# Patient Record
Sex: Female | Born: 1980 | Race: White | Hispanic: No | Marital: Married | Smoking: Never smoker
Health system: Southern US, Community
[De-identification: ages and names within clinical notes are randomized; demographics above are authoritative.]

## PROBLEM LIST (undated history)

## (undated) ENCOUNTER — Inpatient Hospital Stay (HOSPITAL_COMMUNITY): Payer: Self-pay

## (undated) DIAGNOSIS — F419 Anxiety disorder, unspecified: Secondary | ICD-10-CM

## (undated) DIAGNOSIS — E559 Vitamin D deficiency, unspecified: Secondary | ICD-10-CM

## (undated) DIAGNOSIS — G43909 Migraine, unspecified, not intractable, without status migrainosus: Secondary | ICD-10-CM

## (undated) DIAGNOSIS — D649 Anemia, unspecified: Secondary | ICD-10-CM

## (undated) DIAGNOSIS — O139 Gestational [pregnancy-induced] hypertension without significant proteinuria, unspecified trimester: Secondary | ICD-10-CM

## (undated) DIAGNOSIS — T7840XA Allergy, unspecified, initial encounter: Secondary | ICD-10-CM

## (undated) DIAGNOSIS — N393 Stress incontinence (female) (male): Secondary | ICD-10-CM

## (undated) HISTORY — DX: Anxiety disorder, unspecified: F41.9

## (undated) HISTORY — DX: Anemia, unspecified: D64.9

## (undated) HISTORY — PX: TONSILLECTOMY AND ADENOIDECTOMY: SHX28

## (undated) HISTORY — DX: Allergy, unspecified, initial encounter: T78.40XA

## (undated) HISTORY — DX: Stress incontinence (female) (male): N39.3

## (undated) HISTORY — DX: Migraine, unspecified, not intractable, without status migrainosus: G43.909

---

## 2013-11-18 LAB — HM HIV SCREENING LAB: HM HIV Screening: NEGATIVE

## 2017-12-04 ENCOUNTER — Encounter (INDEPENDENT_AMBULATORY_CARE_PROVIDER_SITE_OTHER): Payer: Self-pay

## 2017-12-04 ENCOUNTER — Encounter: Payer: Self-pay | Admitting: Internal Medicine

## 2017-12-04 ENCOUNTER — Ambulatory Visit (INDEPENDENT_AMBULATORY_CARE_PROVIDER_SITE_OTHER): Payer: Managed Care, Other (non HMO) | Admitting: Internal Medicine

## 2017-12-04 DIAGNOSIS — Z9109 Other allergy status, other than to drugs and biological substances: Secondary | ICD-10-CM

## 2017-12-04 NOTE — Progress Notes (Signed)
HPI  Pt presents to the clinic today to establish care and for management of the conditions listed below. She is transferring care from [facility].  Environmental Allergies: Controlled on daily Claritin and Flonase.  Flu: never Tetanus: 12/2014 Pap Smear: 2017 Dentist: biannually  No past medical history on file.  Current Outpatient Medications  Medication Sig Dispense Refill  . Cholecalciferol (VITAMIN D3) 1000 units CAPS Take 1 capsule by mouth daily.    . fluticasone (FLONASE) 50 MCG/ACT nasal spray Place into both nostrils daily.    Marland Kitchen loratadine (CLARITIN) 10 MG tablet Take by mouth.     No current facility-administered medications for this visit.     Allergies  Allergen Reactions  . Codeine Other (See Comments)    Hallucinations  . Penicillins Other (See Comments)    No family history on file.  Social History   Socioeconomic History  . Marital status: Married    Spouse name: Not on file  . Number of children: Not on file  . Years of education: Not on file  . Highest education level: Not on file  Occupational History  . Not on file  Social Needs  . Financial resource strain: Not on file  . Food insecurity:    Worry: Not on file    Inability: Not on file  . Transportation needs:    Medical: Not on file    Non-medical: Not on file  Tobacco Use  . Smoking status: Not on file  Substance and Sexual Activity  . Alcohol use: Not on file  . Drug use: Not on file  . Sexual activity: Not on file  Lifestyle  . Physical activity:    Days per week: Not on file    Minutes per session: Not on file  . Stress: Not on file  Relationships  . Social connections:    Talks on phone: Not on file    Gets together: Not on file    Attends religious service: Not on file    Active member of club or organization: Not on file    Attends meetings of clubs or organizations: Not on file    Relationship status: Not on file  . Intimate partner violence:    Fear of current or  ex partner: Not on file    Emotionally abused: Not on file    Physically abused: Not on file    Forced sexual activity: Not on file  Other Topics Concern  . Not on file  Social History Narrative  . Not on file    ROS:  Constitutional: Denies fever, malaise, fatigue, headache or abrupt weight changes.  HEENT: Denies eye pain, eye redness, ear pain, ringing in the ears, wax buildup, runny nose, nasal congestion, bloody nose, or sore throat. Respiratory: Denies difficulty breathing, shortness of breath, cough or sputum production.   Cardiovascular: Denies chest pain, chest tightness, palpitations or swelling in the hands or feet.  Gastrointestinal: Denies abdominal pain, bloating, constipation, diarrhea or blood in the stool.  GU: Denies frequency, urgency, pain with urination, blood in urine, odor or discharge. Musculoskeletal: Denies decrease in range of motion, difficulty with gait, muscle pain or joint pain and swelling.  Skin: Denies redness, rashes, lesions or ulcercations.  Neurological: Denies dizziness, difficulty with memory, difficulty with speech or problems with balance and coordination.  Psych: Denies anxiety, depression, SI/HI.  No other specific complaints in a complete review of systems (except as listed in HPI above).  PE:  BP 116/78   Pulse  63   Temp 98.1 F (36.7 C) (Oral)   Ht 5' 4.5" (1.638 m)   Wt 168 lb (76.2 kg)   SpO2 98%   BMI 28.39 kg/m   Wt Readings from Last 3 Encounters:  No data found for Wt    General: Appears her stated age, well developed, well nourished in NAD. HEENT: Head: normal shape and size; Eyes: sclera white, no icterus, conjunctiva pink, PERRLA and EOMs intact; Ears: Tm's gray and intact, normal light reflex;Throat/Mouth: Teeth present, mucosa pink and moist, no lesions or ulcerations noted.  Cardiovascular: Normal rate and rhythm. S1,S2 noted.  No murmur, rubs or gallops noted.  Pulmonary/Chest: Normal effort and positive vesicular  breath sounds. No respiratory distress. No wheezes, rales or ronchi noted.  Neurological: Alert and oriented.  Psychiatric: Mood and affect normal. Behavior is normal. Judgment and thought content normal.    Assessment and Plan:

## 2017-12-04 NOTE — Patient Instructions (Signed)
Pelvic Floor Exercises for Bowel Control  Exercises using both the external anal sphincter and the deep pelvic floor muscles can help you to improve your bowel control. When done correctly, these exercises can tone and strengthen the muscles to help you hold back gas and prevent fecal incontinence (leakage of stool). Exercise programs take time; you may not see any noticeable change in your bowel control immediately.  In some cases it may take several months to regain control.  Bowel Control Muscles The anus and the anal canal, has rings of muscle around it. The outer ring of muscle is called the external anal sphincter; it is a voluntary muscle which you can learn to tighten and close more efficiently. When you contract it you will feel the skin around your anus tighten and pull in as if the anus is winking. Try to keep the buttocks muscles relaxed. The inner ring around the anus is the internal anal sphincter. It is an involuntary and automatic muscle; you don't have to think to keep it closed or open.  This muscle should be closed at all times, except when you are actually trying to have a bowel movement.  In addition to the sphincter muscles, there are deeper muscles called the levator ani that form a sling from your tailbone to your pubic bone. The levator ani muscle has a specific part called the puborectalis that holds stool in until you give the signal to relax and empty.  When you contract these muscles it creates a feeling of lifting the anus inward.  External Anal Sphincter        Levator ani deep layer   Effective Exercises for Control of Gas and Bowels . Identify the specific areas of the pelvic floor muscles you need to use.  This can be done using a mirror to see if you are contracting the correct muscles or by placing the pad of your finger at or just inside the anal opening. . Develop an exercise plan for strength, endurance and quick response of the muscles and stick with it.  You  must make the muscles do more than they are used to doing. . Incorporate the exercises into your daily activities.   2007, [employer] Doc.36  

## 2017-12-10 DIAGNOSIS — Z9109 Other allergy status, other than to drugs and biological substances: Secondary | ICD-10-CM | POA: Insufficient documentation

## 2017-12-10 NOTE — Assessment & Plan Note (Signed)
Continue Claritin and Flonase 

## 2018-03-05 ENCOUNTER — Encounter: Payer: Managed Care, Other (non HMO) | Admitting: Internal Medicine

## 2018-04-21 ENCOUNTER — Encounter: Payer: Managed Care, Other (non HMO) | Admitting: Internal Medicine

## 2018-04-21 DIAGNOSIS — Z0289 Encounter for other administrative examinations: Secondary | ICD-10-CM

## 2018-04-21 NOTE — Progress Notes (Deleted)
Subjective:    Patient ID: [name], female    DOB: 04-Nov-1980, 37 y.o.   MRN: [id number]  HPI  Pt presents to the clinic today for her annual exam.  Flu: never Tetanus: 12/2014 Pap Smear: 2017 Dentist:  Diet: Exercise:  Review of Systems  Past Medical History:  Diagnosis Date  . Allergy     Current Outpatient Medications  Medication Sig Dispense Refill  . Cholecalciferol (VITAMIN D3) 1000 units CAPS Take 1 capsule by mouth daily.    . fluticasone (FLONASE) 50 MCG/ACT nasal spray Place into both nostrils daily.    Marland Kitchen loratadine (CLARITIN) 10 MG tablet Take by mouth.     No current facility-administered medications for this visit.     Allergies  Allergen Reactions  . Codeine Other (See Comments)    Hallucinations  . Penicillins Other (See Comments)    Family History  Problem Relation Age of Onset  . Diabetes Mother   . Hypertension Father   . Thyroid disease Sister   . Hypertension Sister   . Thyroid disease Maternal Grandmother   . Skin cancer Maternal Grandmother   . Diabetes Maternal Grandfather   . Glaucoma Maternal Grandfather   . Diabetes Paternal Grandmother   . Skin cancer Paternal Grandfather     Social History   Socioeconomic History  . Marital status: Married    Spouse name: Not on file  . Number of children: Not on file  . Years of education: Not on file  . Highest education level: Not on file  Occupational History  . Not on file  Social Needs  . Financial resource strain: Not on file  . Food insecurity:    Worry: Not on file    Inability: Not on file  . Transportation needs:    Medical: Not on file    Non-medical: Not on file  Tobacco Use  . Smoking status: Never Smoker  . Smokeless tobacco: Never Used  Substance and Sexual Activity  . Alcohol use: Yes    Comment: occasional  . Drug use: Never  . Sexual activity: Not on file  Lifestyle  . Physical activity:    Days per week: Not on file    Minutes per session: Not on  file  . Stress: Not on file  Relationships  . Social connections:    Talks on phone: Not on file    Gets together: Not on file    Attends religious service: Not on file    Active member of club or organization: Not on file    Attends meetings of clubs or organizations: Not on file    Relationship status: Not on file  . Intimate partner violence:    Fear of current or ex partner: Not on file    Emotionally abused: Not on file    Physically abused: Not on file    Forced sexual activity: Not on file  Other Topics Concern  . Not on file  Social History Narrative  . Not on file     Constitutional: Denies fever, malaise, fatigue, headache or abrupt weight changes.  HEENT: Denies eye pain, eye redness, ear pain, ringing in the ears, wax buildup, runny nose, nasal congestion, bloody nose, or sore throat. Respiratory: Denies difficulty breathing, shortness of breath, cough or sputum production.   Cardiovascular: Denies chest pain, chest tightness, palpitations or swelling in the hands or feet.  Gastrointestinal: Denies abdominal pain, bloating, constipation, diarrhea or blood in the stool.  GU: Denies urgency, frequency,  pain with urination, burning sensation, blood in urine, odor or discharge. Musculoskeletal: Denies decrease in range of motion, difficulty with gait, muscle pain or joint pain and swelling.  Skin: Denies redness, rashes, lesions or ulcercations.  Neurological: Denies dizziness, difficulty with memory, difficulty with speech or problems with balance and coordination.  Psych: Denies anxiety, depression, SI/HI.  No other specific complaints in a complete review of systems (except as listed in HPI above).     Objective:   Physical Exam        Assessment & Plan:

## 2018-09-08 ENCOUNTER — Other Ambulatory Visit (INDEPENDENT_AMBULATORY_CARE_PROVIDER_SITE_OTHER): Payer: Managed Care, Other (non HMO)

## 2018-09-08 VITALS — BP 128/84 | HR 81

## 2018-09-08 DIAGNOSIS — Z3201 Encounter for pregnancy test, result positive: Secondary | ICD-10-CM | POA: Diagnosis not present

## 2018-09-08 LAB — POCT URINE PREGNANCY: Preg Test, Ur: POSITIVE — AB

## 2018-09-08 NOTE — Progress Notes (Signed)
Pt here today for UPT. Had 3 positive tests at home. Per pt LMP was 07/27/2018.  UPT positive today in office, according to LMP pt is  6weeks 1day. Reviewed medications with patient, pt now taking PNV and no other medications. Denies any issues at this time. Pt will follow up with new OB visit on 10/14/2018.   Scheryl Marten, RN

## 2018-09-14 NOTE — Progress Notes (Signed)
I have reviewed the chart and agree with nursing staff's documentation of this patient's encounter.  Thaddeus Evitts, MD 09/14/2018 10:42 AM    

## 2018-09-22 ENCOUNTER — Telehealth: Payer: Self-pay | Admitting: *Deleted

## 2018-09-22 NOTE — Telephone Encounter (Signed)
Pt called in stating she is having some cramping and light spotting, pt is around [redacted] weeks pregnant and did have intercourse this morning. Instructed pt to continue to monitor  And if spotting or cramping get worse to call office or go to [facility] for further evaluation.   Scheryl Marten, RN

## 2018-09-29 ENCOUNTER — Inpatient Hospital Stay (HOSPITAL_COMMUNITY): Payer: Managed Care, Other (non HMO)

## 2018-09-29 ENCOUNTER — Encounter (HOSPITAL_COMMUNITY): Payer: Self-pay

## 2018-09-29 ENCOUNTER — Telehealth: Payer: Self-pay

## 2018-09-29 ENCOUNTER — Other Ambulatory Visit: Payer: Self-pay

## 2018-09-29 ENCOUNTER — Inpatient Hospital Stay (HOSPITAL_COMMUNITY)
Admission: AD | Admit: 2018-09-29 | Discharge: 2018-09-29 | Disposition: A | Discharge status: Home / Self Care | Payer: Managed Care, Other (non HMO) | Source: Ambulatory Visit | Attending: Obstetrics & Gynecology | Admitting: Obstetrics & Gynecology

## 2018-09-29 DIAGNOSIS — Z880 Allergy status to penicillin: Secondary | ICD-10-CM | POA: Diagnosis not present

## 2018-09-29 DIAGNOSIS — O99281 Endocrine, nutritional and metabolic diseases complicating pregnancy, first trimester: Secondary | ICD-10-CM | POA: Insufficient documentation

## 2018-09-29 DIAGNOSIS — Z833 Family history of diabetes mellitus: Secondary | ICD-10-CM | POA: Insufficient documentation

## 2018-09-29 DIAGNOSIS — Z885 Allergy status to narcotic agent status: Secondary | ICD-10-CM | POA: Insufficient documentation

## 2018-09-29 DIAGNOSIS — O131 Gestational [pregnancy-induced] hypertension without significant proteinuria, first trimester: Secondary | ICD-10-CM | POA: Insufficient documentation

## 2018-09-29 DIAGNOSIS — Z3A09 9 weeks gestation of pregnancy: Secondary | ICD-10-CM | POA: Insufficient documentation

## 2018-09-29 DIAGNOSIS — O200 Threatened abortion: Secondary | ICD-10-CM | POA: Diagnosis present

## 2018-09-29 DIAGNOSIS — E559 Vitamin D deficiency, unspecified: Secondary | ICD-10-CM | POA: Insufficient documentation

## 2018-09-29 DIAGNOSIS — Z8249 Family history of ischemic heart disease and other diseases of the circulatory system: Secondary | ICD-10-CM | POA: Insufficient documentation

## 2018-09-29 DIAGNOSIS — Z6740 Type O blood, Rh positive: Secondary | ICD-10-CM

## 2018-09-29 DIAGNOSIS — Z808 Family history of malignant neoplasm of other organs or systems: Secondary | ICD-10-CM | POA: Insufficient documentation

## 2018-09-29 DIAGNOSIS — O4690 Antepartum hemorrhage, unspecified, unspecified trimester: Secondary | ICD-10-CM

## 2018-09-29 HISTORY — DX: Vitamin D deficiency, unspecified: E55.9

## 2018-09-29 HISTORY — DX: Gestational (pregnancy-induced) hypertension without significant proteinuria, unspecified trimester: O13.9

## 2018-09-29 LAB — HCG, QUANTITATIVE, PREGNANCY: hCG, Beta Chain, Quant, S: 29712 m[IU]/mL — ABNORMAL HIGH (ref <5)

## 2018-09-29 LAB — URINALYSIS, ROUTINE W REFLEX MICROSCOPIC
BILIRUBIN URINE: NEGATIVE
Bacteria, UA: NONE SEEN
GLUCOSE, UA: NEGATIVE mg/dL
Ketones, ur: NEGATIVE mg/dL
Leukocytes,Ua: NEGATIVE
NITRITE: NEGATIVE
Protein, ur: NEGATIVE mg/dL
Specific Gravity, Urine: 1.002 — ABNORMAL LOW (ref 1.005–1.030)
pH: 7 (ref 5.0–8.0)

## 2018-09-29 LAB — CBC
HCT: 37.1 % (ref 36.0–46.0)
Hemoglobin: 12.4 g/dL (ref 12.0–15.0)
MCH: 28.6 pg (ref 26.0–34.0)
MCHC: 33.4 g/dL (ref 30.0–36.0)
MCV: 85.5 fL (ref 80.0–100.0)
Platelets: 287 10*3/uL (ref 150–400)
RBC: 4.34 MIL/uL (ref 3.87–5.11)
RDW: 11.7 % (ref 11.5–15.5)
WBC: 8.6 10*3/uL (ref 4.0–10.5)
nRBC: 0 % (ref 0.0–0.2)

## 2018-09-29 LAB — WET PREP, GENITAL
Clue Cells Wet Prep HPF POC: NONE SEEN
Sperm: NONE SEEN
Trich, Wet Prep: NONE SEEN
Yeast Wet Prep HPF POC: NONE SEEN

## 2018-09-29 LAB — ABO/RH: ABO/RH(D): O POS

## 2018-09-29 NOTE — Discharge Instructions (Signed)
Threatened Miscarriage  A threatened miscarriage occurs when a woman has vaginal bleeding during the first 20 weeks of pregnancy but the pregnancy has not ended. If you have vaginal bleeding during this time, your health care provider will do tests to make sure you are still pregnant. If the tests show that you are still pregnant and that the developing baby (fetus) inside your uterus is still growing, your condition is considered a threatened miscarriage. A threatened miscarriage does not mean your pregnancy will end, but it does increase the risk of losing your pregnancy (complete miscarriage). What are the causes? The cause of this condition is usually not known. For women who go on to have a complete miscarriage, the most common cause is an abnormal number of chromosomes in the developing baby. Chromosomes are the structures inside cells that hold all of a person's genetic material. What increases the risk? The following lifestyle factors may increase your risk of a miscarriage in early pregnancy:  Smoking.  Drinking excessive amounts of alcohol or caffeine.  Recreational drug use. The following preexisting health conditions may increase your risk of a miscarriage in early pregnancy:  Polycystic ovary syndrome.  Uterine fibroids.  Infections.  Diabetes mellitus. What are the signs or symptoms? Symptoms of this condition include:  Vaginal bleeding.  Mild abdominal pain or cramps. How is this diagnosed? If you have bleeding with or without abdominal pain before 20 weeks of pregnancy, your health care provider will do tests to check whether you are still pregnant. These will include:  Ultrasound. This test uses sound waves to create images of the inside of your uterus. This allows your health care provider to look at your developing baby and other structures, such as your placenta.  Pelvic exam. This is an internal exam of your vagina and cervix.  Measurement of your baby's heart  rate.  Laboratory tests such as blood tests, urine tests, or swabs for infection You may be diagnosed with a threatened miscarriage if:  Ultrasound testing shows that you are still pregnant.  Your baby's heart rate is strong.  A pelvic exam shows that the opening between your uterus and your vagina (cervix) is closed.  Blood tests confirm that you are still pregnant. How is this treated? No treatments have been shown to prevent a threatened miscarriage from going on to a complete miscarriage. However, the right home care is important. Follow these instructions at home:  Get plenty of rest.  Do not have sex or use tampons if you have vaginal bleeding.  Do not douche.  Do not smoke or use recreational drugs.  Do not drink alcohol.  Avoid caffeine.  Keep all follow-up prenatal visits as told by your health care provider. This is important. Contact a health care provider if:  You have light vaginal bleeding or spotting while pregnant.  You have abdominal pain or cramping.  You have a fever. Get help right away if:  You have heavy vaginal bleeding.  You have blood clots coming from your vagina.  You pass tissue from your vagina.  You leak fluid, or you have a gush of fluid from your vagina.  You have severe low back pain or abdominal cramps.  You have fever, chills, and severe abdominal pain. Summary  A threatened miscarriage occurs when a woman has vaginal bleeding during the first 20 weeks of pregnancy but the pregnancy has not ended.  The cause of a threatened miscarriage is usually not known.  Symptoms of this condition may   include vaginal bleeding and mild abdominal pain or cramps.  No treatments have been shown to prevent a threatened miscarriage from going on to a complete miscarriage.  Keep all follow-up prenatal visits as told by your health care provider. This is important. This information is not intended to replace advice given to you by your health  care provider. Make sure you discuss any questions you have with your health care provider. Document Released: 06/17/2005 Document Revised: 09/13/2016 Document Reviewed: 09/13/2016 Elsevier Interactive Patient Education  2019 Elsevier Inc.  

## 2018-09-29 NOTE — MAU Note (Signed)
Pt has had spotting for a week, yesterday had a clot. Having some cramping for a week and having more pains in her lower abdomen and back. Has a HA today. Has had past miscarriage. Abdominal pain is 4/10. HA is 4-5/10. Did not have tylenol to take.

## 2018-09-29 NOTE — Telephone Encounter (Signed)
Patient called the on call number last pm regarding having some spotting when she wipes. She reports not soaking a pad every hour but she is having some cramping. I have advised patient to continued to monitored her symptoms at this time and if they get worse she should reach out to the office or go to MAU To be seen. Patient is around [redacted] weeks pregnant. Patient voice understanding at this time.

## 2018-09-29 NOTE — MAU Provider Note (Signed)
History     CSN: [id number]  Arrival date and time: 09/29/18 1150   First Provider Initiated Contact with Patient 09/29/18 1324      Chief Complaint  Patient presents with  . Vaginal Bleeding   Summer Shaffer is a 38 y.o. G3P1011 at [redacted]w[redacted]d who presents today with spotting and cramping x 1 week. She planning to be seen at the [facility] office, and has had a UPT done there on 3/10. She has a NOB visit scheduled for 10/14/2018  Vaginal Bleeding  The patient's primary symptoms include pelvic pain and vaginal bleeding. This is a new problem. The current episode started in the past 7 days. The problem occurs intermittently. The problem has been unchanged. Pain severity now: 3/10. The problem affects both sides. She is pregnant. Associated symptoms include back pain and nausea. Pertinent negatives include no chills, dysuria, fever, hematuria, urgency or vomiting. The vaginal discharge was bloody. The vaginal bleeding is spotting. She has been passing clots (about the size of a pea ). She has not been passing tissue. Nothing aggravates the symptoms. She has tried nothing for the symptoms. Her menstrual history has been regular (LMP 07/27/18).    OB History    Gravida  3   Para  1   Term  1   Preterm      AB  1   Living  1     SAB  1   TAB      Ectopic      Multiple      Live Births  1           Past Medical History:  Diagnosis Date  . Allergy   . Pregnancy induced hypertension   . Vitamin D deficiency     Past Surgical History:  Procedure Laterality Date  . TONSILLECTOMY AND ADENOIDECTOMY      Family History  Problem Relation Age of Onset  . Diabetes Mother   . Hypertension Father   . Thyroid disease Sister   . Hypertension Sister   . Thyroid disease Maternal Grandmother   . Skin cancer Maternal Grandmother   . Diabetes Maternal Grandfather   . Glaucoma Maternal Grandfather   . Diabetes Paternal Grandmother   . Skin cancer Paternal Grandfather     Social  History   Tobacco Use  . Smoking status: Never Smoker  . Smokeless tobacco: Never Used  Substance Use Topics  . Alcohol use: Not Currently    Comment: occasional  . Drug use: Never    Allergies:  Allergies  Allergen Reactions  . Codeine Other (See Comments)    Hallucinations  . Penicillins Rash    AS A BABY     No medications prior to admission.    Review of Systems  Constitutional: Negative for chills and fever.  Gastrointestinal: Positive for nausea. Negative for vomiting.  Genitourinary: Positive for pelvic pain and vaginal bleeding. Negative for dysuria, hematuria and urgency.  Musculoskeletal: Positive for back pain.   Physical Exam   Blood pressure 127/83, pulse 75, temperature 98.4 F (36.9 C), temperature source Oral, resp. rate 16, height 5\' 4"  (1.626 m), weight 80.6 kg, last menstrual period 07/27/2018, SpO2 100 %.  Physical Exam  Nursing note and vitals reviewed. Constitutional: She is oriented to person, place, and time. She appears well-developed and well-nourished. No distress.  HENT:  Head: Normocephalic.  Cardiovascular: Normal rate.  Respiratory: Effort normal.  GI: Soft. There is no abdominal tenderness. There is no rebound.  Neurological: She  is alert and oriented to person, place, and time.  Skin: Skin is warm and dry.  Psychiatric: She has a normal mood and affect.   Results for orders placed or performed during the hospital encounter of 09/29/18 (from the past 24 hour(s))  Urinalysis, Routine w reflex microscopic     Status: Abnormal   Collection Time: 09/29/18 12:58 PM  Result Value Ref Range   Color, Urine COLORLESS (A) YELLOW   APPearance CLEAR CLEAR   Specific Gravity, Urine 1.002 (L) 1.005 - 1.030   pH 7.0 5.0 - 8.0   Glucose, UA NEGATIVE NEGATIVE mg/dL   Hgb urine dipstick MODERATE (A) NEGATIVE   Bilirubin Urine NEGATIVE NEGATIVE   Ketones, ur NEGATIVE NEGATIVE mg/dL   Protein, ur NEGATIVE NEGATIVE mg/dL   Nitrite NEGATIVE  NEGATIVE   Leukocytes,Ua NEGATIVE NEGATIVE   RBC / HPF 0-5 0 - 5 RBC/hpf   WBC, UA 0-5 0 - 5 WBC/hpf   Bacteria, UA NONE SEEN NONE SEEN   Squamous Epithelial / LPF 0-5 0 - 5  CBC     Status: None   Collection Time: 09/29/18  1:58 PM  Result Value Ref Range   WBC 8.6 4.0 - 10.5 K/uL   RBC 4.34 3.87 - 5.11 MIL/uL   Hemoglobin 12.4 12.0 - 15.0 g/dL   HCT 14.7 82.9 - 56.2 %   MCV 85.5 80.0 - 100.0 fL   MCH 28.6 26.0 - 34.0 pg   MCHC 33.4 30.0 - 36.0 g/dL   RDW 13.0 86.5 - 78.4 %   Platelets 287 150 - 400 K/uL   nRBC 0.0 0.0 - 0.2 %  hCG, quantitative, pregnancy     Status: Abnormal   Collection Time: 09/29/18  1:58 PM  Result Value Ref Range   hCG, Beta Chain, Quant, S 29,712 (H) <5 mIU/mL  ABO/Rh     Status: None   Collection Time: 09/29/18  1:58 PM  Result Value Ref Range   ABO/RH(D) O POS    No rh immune globuloin      NOT A RH IMMUNE GLOBULIN CANDIDATE, PT RH POSITIVE Performed at [facility], [address], [place], [place] [address]   Wet prep, genital     Status: Abnormal   Collection Time: 09/29/18  2:01 PM  Result Value Ref Range   Yeast Wet Prep HPF POC NONE SEEN NONE SEEN   Trich, Wet Prep NONE SEEN NONE SEEN   Clue Cells Wet Prep HPF POC NONE SEEN NONE SEEN   WBC, Wet Prep HPF POC MANY (A) NONE SEEN   Sperm NONE SEEN    US Ob Less Than 14 Weeks With Ob Transvaginal  Result Date: 09/29/2018 CLINICAL DATA:  38 year old pregnant female presents with abdominal pain and vaginal bleeding for 1 week. Quantitative beta hCG unavailable at the time of this dictation. EDC by LMP: 05/03/2019, projecting to an expected gestational age of [redacted] weeks 1 day. EXAM: OBSTETRIC <14 WK [place] AND TRANSVAGINAL OB US TECHNIQUE: Both transabdominal and transvaginal ultrasound examinations were performed for complete evaluation of the gestation as well as the maternal uterus, adnexal regions, and pelvic cul-de-sac. Transvaginal technique was performed to assess early pregnancy. COMPARISON:   None. FINDINGS: Intrauterine gestational sac: Single intrauterine gestational sac is normal in position and contour. Yolk sac:  Visualized. Embryo:  Visualized. Embryonic Cardiac Activity: Not Visualized. CRL:  1.4 mm   out of range (less than 5 weeks) Subchorionic hemorrhage: Small perigestational bleed suggested in the lower cavity  involving less than 30% of the gestational sac circumference. Maternal uterus/adnexae: Retroverted uterus with no uterine fibroids. Right ovary measures 4.1 x 2.1 x 2.9 cm and contains a corpus luteum. Left ovary measures 3.8 x 2.0 x 1.5 cm. No abnormal ovarian or adnexal masses. No abnormal free fluid in the pelvis. IMPRESSION: 1. Single intrauterine gestational sac with yolk sac and tiny 1 mm embryonic pole, measuring less than [redacted] weeks gestational age, which is discordant with the expected gestational age of [redacted] weeks 1 day by provided menstrual dates. No embryonic cardiac activity. Small perigestational bleed. Findings are suspicious but not yet definitive for nonviable pregnancy. Recommend follow-up US in 11-14 days for definitive diagnosis. This recommendation follows SRU consensus guidelines: Diagnostic Criteria for Nonviable Pregnancy Early in the First Trimester. Malva Limes Med 2013; 409:8119-14. 2. No ovarian or adnexal abnormality.  No free fluid in the pelvis. Electronically Signed   By: Delbert Phenix M.D.   On: 09/29/2018 14:44    MAU Course  Procedures  MDM   Assessment and Plan   1. Vaginal bleeding in pregnancy   2. Threatened abortion in first trimester   3. Type O blood, Rh positive    DC home Comfort measures reviewed  1st Trimester precautions  Bleeding precautions RX: none  Return to MAU as needed FU [place] to be done around 10/09/2018, they will call her with an appointment  Follow-up Information    [facility] Follow up.   Why:  They will call you with an ultrasound appointment          Thressa Sheller DNP, CNM  09/29/18  4:05 PM

## 2018-09-30 LAB — GC/CHLAMYDIA PROBE AMP (~~LOC~~) NOT AT ARMC
Chlamydia: NEGATIVE
Neisseria Gonorrhea: NEGATIVE

## 2018-10-06 ENCOUNTER — Inpatient Hospital Stay (HOSPITAL_COMMUNITY)
Admission: AD | Admit: 2018-10-06 | Discharge: 2018-10-06 | Disposition: A | Discharge status: Home / Self Care | Payer: Managed Care, Other (non HMO) | Attending: Obstetrics & Gynecology | Admitting: Obstetrics & Gynecology

## 2018-10-06 ENCOUNTER — Inpatient Hospital Stay (HOSPITAL_COMMUNITY): Payer: Managed Care, Other (non HMO)

## 2018-10-06 ENCOUNTER — Other Ambulatory Visit: Payer: Self-pay

## 2018-10-06 ENCOUNTER — Encounter (HOSPITAL_COMMUNITY): Payer: Self-pay | Admitting: *Deleted

## 2018-10-06 DIAGNOSIS — O209 Hemorrhage in early pregnancy, unspecified: Secondary | ICD-10-CM

## 2018-10-06 DIAGNOSIS — O039 Complete or unspecified spontaneous abortion without complication: Secondary | ICD-10-CM | POA: Diagnosis not present

## 2018-10-06 DIAGNOSIS — Z79899 Other long term (current) drug therapy: Secondary | ICD-10-CM | POA: Insufficient documentation

## 2018-10-06 DIAGNOSIS — Z3A10 10 weeks gestation of pregnancy: Secondary | ICD-10-CM | POA: Insufficient documentation

## 2018-10-06 DIAGNOSIS — E559 Vitamin D deficiency, unspecified: Secondary | ICD-10-CM | POA: Insufficient documentation

## 2018-10-06 DIAGNOSIS — O208 Other hemorrhage in early pregnancy: Secondary | ICD-10-CM | POA: Diagnosis not present

## 2018-10-06 LAB — CBC
HCT: 34.2 % — ABNORMAL LOW (ref 36.0–46.0)
Hemoglobin: 11.6 g/dL — ABNORMAL LOW (ref 12.0–15.0)
MCH: 29.2 pg (ref 26.0–34.0)
MCHC: 33.9 g/dL (ref 30.0–36.0)
MCV: 86.1 fL (ref 80.0–100.0)
Platelets: 275 10*3/uL (ref 150–400)
RBC: 3.97 MIL/uL (ref 3.87–5.11)
RDW: 11.7 % (ref 11.5–15.5)
WBC: 8.6 10*3/uL (ref 4.0–10.5)
nRBC: 0 % (ref 0.0–0.2)

## 2018-10-06 LAB — HCG, QUANTITATIVE, PREGNANCY: hCG, Beta Chain, Quant, S: 15414 m[IU]/mL — ABNORMAL HIGH (ref <5)

## 2018-10-06 MED ORDER — TRAMADOL HCL 50 MG PO TABS
50.0000 mg | ORAL_TABLET | Freq: Once | ORAL | Status: AC
  Administered 2018-10-06: 50 mg via ORAL
  Filled 2018-10-06: qty 1

## 2018-10-06 NOTE — Discharge Instructions (Signed)
Coping with Pregnancy Loss °Pregnancy loss can happen any time during a pregnancy. Often the cause is not known. It is rarely because of anything you did. Pregnancy loss in early pregnancy (during the first trimester) is called a miscarriage. This type of pregnancy loss is the most common. Pregnancy loss that happens after 20 weeks of pregnancy is called fetal demise if the baby's heart stops beating before birth. Fetal demise is much less common. Some women experience spontaneous labor shortly after fetal demise resulting in a stillborn birth (stillbirth). °Any pregnancy loss can be devastating. You will need to recover both physically and emotionally. Most women are able to get pregnant again after a pregnancy loss and deliver a healthy baby. °How to manage emotional recovery ° °Pregnancy loss is very hard emotionally. You may feel many different emotions while you grieve. You may feel sad and angry. You may also feel guilty. It is normal to have periods of crying. Emotional recovery can take longer than physical recovery. It is different for everyone. °Taking these steps can help you cope: °· Remember that it is unlikely you did anything to cause the pregnancy loss. °· Share your thoughts and feelings with friends, family, and your partner. Remember that your partner is also recovering emotionally. °· Make sure you have a good support system, and do not spend too much time alone. °· Meet with a pregnancy loss counselor or join a pregnancy loss support group. °· Get enough sleep and eat a healthy diet. Return to regular exercise when you have recovered physically. °· Do not use drugs or alcohol to manage your emotions. °· Consider seeing a mental health professional to help you recover emotionally. °· Ask a friend or loved one to help you decide what to do with any clothing and nursery items you received for your baby. °In the case of a stillbirth, many women benefit from taking additional steps in the grieving  process. You may want to: °· Hold your baby after the birth. °· Name your baby. °· Request a birth certificate. °· Create a keepsake such as handprints or footprints. °· Dress your baby and have a picture taken. °· Make funeral arrangements. °· Ask for a baptism or blessing. °Hospitals have staff members who can help you with all these arrangements. °How to recognize emotional stress °It is normal to have emotional stress after a pregnancy loss. But emotional stress that lasts a long time or becomes severe requires treatment. Watch out for these signs of severe emotional stress: °· Sadness, anger, or guilt that is not going away and is interfering with your normal activities. °· Relationship problems that have occurred or gotten worse since the pregnancy loss. °· Signs of depression that last longer than 2 weeks. These may include: °? Sadness. °? Anxiety. °? Hopelessness. °? Loss of interest in activities you enjoy. °? Inability to concentrate. °? Trouble sleeping or sleeping too much. °? Loss of appetite or overeating. °? Thoughts of death or of hurting yourself. °Follow these instructions at home: °Medicines °· Take over-the-counter and prescription medicines only as told by your health care provider. °Activity °· Rest at home until your energy level returns. Return to your normal activities as told by your health care provider. Ask your health care provider what activities are safe for you. °General instructions °· Keep all follow-up visits as told by your health care provider. This is important. °· It may be helpful to meet with others who have experienced pregnancy loss. Ask your health   care provider about support groups and resources. °· To help you and your partner with the process of grieving, talk with your health care provider or seek counseling. °· When you are ready, meet with your health care provider to discuss steps to take for a future pregnancy. °Where to find more information °· [place] Department of  Health and Human Services Office on Women's Health: www.womenshealth.gov °· American Pregnancy Association: www.americanpregnancy.org °Contact a health care provider if: °· You continue to experience grief, sadness, or lack of motivation for everyday activities, and those feelings do not improve over time. °· You are struggling to recover emotionally, especially if you are using alcohol or substances to help. °Get help right away if: °· You have thoughts of hurting yourself or others. °If you ever feel like you may hurt yourself or others, or have thoughts about taking your own life, get help right away. You can go to your nearest emergency department or call: °· Your local emergency services (911 in the [place] °· A suicide crisis helpline, such as the National Suicide Prevention Lifeline at 1-800-273-8255. This is open 24 hours a day. °Summary °· Any pregnancy loss can be difficult physically and emotionally. °· You may experience many different emotions while you grieve. Emotional recovery can last longer than physical recovery. °· It is normal to have emotional stress after a pregnancy loss. But emotional stress that lasts a long time or becomes severe requires treatment. °· See your health care provider if you are struggling emotionally after a pregnancy loss. °This information is not intended to replace advice given to you by your health care provider. Make sure you discuss any questions you have with your health care provider. °Document Released: 08/28/2017 Document Revised: 08/28/2017 Document Reviewed: 08/28/2017 °Elsevier Interactive Patient Education © 2019 Else[employer] Inc. ° ° °Miscarriage °A miscarriage is the loss of an unborn baby (fetus) before the 20th week of pregnancy. Most miscarriages happen during the first 3 months of pregnancy. Sometimes, a miscarriage can happen before a woman knows that she is pregnant. °Having a miscarriage can be an emotional experience. If you have had a miscarriage, talk  with your health care provider about any questions you may have about miscarrying, the grieving process, and your plans for future pregnancy. °What are the causes? °A miscarriage may be caused by: °· Problems with the genes or chromosomes of the fetus. These problems make it impossible for the baby to develop normally. They are often the result of random errors that occur early in the development of the baby, and are not passed from parent to child (not inherited). °· Infection of the cervix or uterus. °· Conditions that affect hormone balance in the body. °· Problems with the cervix, such as the cervix opening and thinning before pregnancy is at term (cervical insufficiency). °· Problems with the uterus. These may include: °? A uterus with an abnormal shape. °? Fibroids in the uterus. °? Congenital abnormalities. These are problems that were present at birth. °· Certain medical conditions. °· Smoking, drinking alcohol, or using drugs. °· Injury (trauma). °In many cases, the cause of a miscarriage is not known. °What are the signs or symptoms? °Symptoms of this condition include: °· Vaginal bleeding or spotting, with or without cramps or pain. °· Pain or cramping in the abdomen or lower back. °· Passing fluid, tissue, or blood clots from the vagina. °How is this diagnosed? °This condition may be diagnosed based on: °· A physical exam. °· Ultrasound. °· Blood tests. °·   Urine tests. °How is this treated? °Treatment for a miscarriage is sometimes not necessary if you naturally pass all the tissue that was in your uterus. If necessary, this condition may be treated with: °· Dilation and curettage (D&C). This is a procedure in which the cervix is stretched open and the lining of the uterus (endometrium) is scraped. This is done only if tissue from the fetus or placenta remains in the body (incomplete miscarriage). °· Medicines, such as: °? Antibiotic medicine, to treat infection. °? Medicine to help the body pass any  remaining tissue. °? Medicine to reduce (contract) the size of the uterus. These medicines may be given if you have a lot of bleeding. °If you have Rh negative blood and your baby was Rh positive, you will need a shot of a medicine called Rh immunoglobulinto protect your future babies from Rh blood problems. "Rh-negative" and "Rh-positive" refer to whether or not the blood has a specific protein found on the surface of red blood cells (Rh factor). °Follow these instructions at home: °Medicines ° °· Take over-the-counter and prescription medicines only as told by your health care provider. °· If you were prescribed antibiotic medicine, take it as told by your health care provider. Do not stop taking the antibiotic even if you start to feel better. °· Do not take NSAIDs, such as aspirin and ibuprofen, unless they are approved by your health care provider. These medicines can cause bleeding. °Activity °· Rest as directed. Ask your health care provider what activities are safe for you. °· Have someone help with home and family responsibilities during this time. °General instructions °· Keep track of the number of sanitary pads you use each day and how soaked (saturated) they are. Write down this information. °· Monitor the amount of tissue or blood clots that you pass from your vagina. Save any large amounts of tissue for your health care provider to examine. °· Do not use tampons, douche, or have sex until your health care provider approves. °· To help you and your partner with the process of grieving, talk with your health care provider or seek counseling. °· When you are ready, meet with your health care provider to discuss any important steps you should take for your health. Also, discuss steps you should take to have a healthy pregnancy in the future. °· Keep all follow-up visits as told by your health care provider. This is important. °Where to find more information °· The American Congress of Obstetricians and  Gynecologists: www.acog.org °· [place]. Department of Health and Human Services Office of Women’s Health: www.womenshealth.gov °Contact a health care provider if: °· You have a fever or chills. °· You have a foul smelling vaginal discharge. °· You have more bleeding instead of less. °Get help right away if: °· You have severe cramps or pain in your back or abdomen. °· You pass blood clots or tissue from your vagina that is walnut-sized or larger. °· You soak more than 1 regular sanitary pad in an hour. °· You become light-headed or weak. °· You pass out. °· You have feelings of sadness that take over your thoughts, or you have thoughts of hurting yourself. °Summary °· Most miscarriages happen in the first 3 months of pregnancy. Sometimes miscarriage happens before a woman even knows that she is pregnant. °· Follow your health care provider's instruction for home care. Keep all follow-up appointments. °· To help you and your partner with the process of grieving, talk with your health care   provider or seek counseling. °This information is not intended to replace advice given to you by your health care provider. Make sure you discuss any questions you have with your health care provider. °Document Released: 12/11/2000 Document Revised: 07/23/2016 Document Reviewed: 07/23/2016 °Elsevier Interactive Patient Education © 2019 Elsevier Inc. ° °

## 2018-10-06 NOTE — MAU Provider Note (Signed)
History     CSN: [id number]  Arrival date and time: 10/06/18 1608   First Provider Initiated Contact with Patient 10/06/18 1708      Chief Complaint  Patient presents with  . Miscarriage   Summer Shaffer is a 38 y.o. G3P1011 at [id number] who presents for Miscarriage.  She states she has been bleeding on and off for the past two weeks.  She states she has been experiencing cramping since last night that improved and then "went down hill around 130ish."  She reports that she has been cramping for awhile, but not as severe as today.  She reports that she has gone through 3-4 pads with clots ranging from a quarter to the size of a mandarin orange.  She reports that her cramping "in this moment is like a 4," but was a 9/10 upon arrival.  She reports passing a few large clots since arrival.       OB History    Gravida  3   Para  1   Term  1   Preterm      AB  1   Living  1     SAB  1   TAB      Ectopic      Multiple      Live Births  1           Past Medical History:  Diagnosis Date  . Allergy   . Pregnancy induced hypertension   . Vitamin D deficiency     Past Surgical History:  Procedure Laterality Date  . TONSILLECTOMY AND ADENOIDECTOMY      Family History  Problem Relation Age of Onset  . Diabetes Mother   . Hypertension Father   . Thyroid disease Sister   . Hypertension Sister   . Thyroid disease Maternal Grandmother   . Skin cancer Maternal Grandmother   . Diabetes Maternal Grandfather   . Glaucoma Maternal Grandfather   . Diabetes Paternal Grandmother   . Skin cancer Paternal Grandfather     Social History   Tobacco Use  . Smoking status: Never Smoker  . Smokeless tobacco: Never Used  Substance Use Topics  . Alcohol use: Not Currently    Comment: occasional  . Drug use: Never    Allergies:  Allergies  Allergen Reactions  . Codeine Other (See Comments)    Hallucinations  . Penicillins Rash    AS A BABY     Medications Prior to  Admission  Medication Sig Dispense Refill Last Dose  . Cholecalciferol (VITAMIN D3) 1000 units CAPS Take 1 capsule by mouth daily.   10/06/2018 at Unknown time  . fluticasone (FLONASE) 50 MCG/ACT nasal spray Place into both nostrils daily.   10/06/2018 at Unknown time  . loratadine (CLARITIN) 10 MG tablet Take by mouth.   10/06/2018 at Unknown time    Review of Systems  Constitutional: Negative for chills and fever.  Respiratory: Negative for cough and shortness of breath.   Gastrointestinal: Positive for abdominal pain (Cramping). Negative for constipation, diarrhea, nausea and vomiting.  Genitourinary: Positive for vaginal bleeding. Negative for dysuria.  Neurological: Negative for dizziness, light-headedness and headaches.   Physical Exam   Blood pressure (!) 141/86, pulse 89, temperature 98.4 F (36.9 C), resp. rate 16, height 5\' 4"  (1.626 m), weight 80.6 kg, last menstrual period 07/27/2018, SpO2 100 %.  Physical Exam  Constitutional: She is oriented to person, place, and time. She appears well-developed and well-nourished.  HENT:  Head: Normocephalic  and atraumatic.  Eyes: Conjunctivae are normal.  Neck: Normal range of motion.  Cardiovascular: Normal rate.  Respiratory: Effort normal.  GI: Soft.  Genitourinary:    Genitourinary Comments: Speculum Exam: -Vaginal Vault: Pink mucosa, Copious amt of blood in vault-Blood removed from vault with 4x4 swab and ring forcep. -Cervix: Pink, no lesions, cysts, or polyps.  Appears open-clots and questionable GS removed from os with ring forceps. Scant amt active bleeding from os -Bimanual Exam: Deferred   Musculoskeletal: Normal range of motion.  Neurological: She is alert and oriented to person, place, and time.  Skin: Skin is warm and dry.  Psychiatric: She has a normal mood and affect. Her behavior is normal.    MAU Course  Procedures Results for orders placed or performed during the hospital encounter of 10/06/18 (from the past 24  hour(s))  CBC     Status: Abnormal   Collection Time: 10/06/18  5:07 PM  Result Value Ref Range   WBC 8.6 4.0 - 10.5 K/uL   RBC 3.97 3.87 - 5.11 MIL/uL   Hemoglobin 11.6 (L) 12.0 - 15.0 g/dL   HCT 91.7 (L) 91.5 - 05.6 %   MCV 86.1 80.0 - 100.0 fL   MCH 29.2 26.0 - 34.0 pg   MCHC 33.9 30.0 - 36.0 g/dL   RDW 97.9 48.0 - 16.5 %   Platelets 275 150 - 400 K/uL   nRBC 0.0 0.0 - 0.2 %  hCG, quantitative, pregnancy     Status: Abnormal   Collection Time: 10/06/18  5:07 PM  Result Value Ref Range   hCG, Beta Chain, Quant, S 15,414 (H) <5 mIU/mL    MDM Pelvic Exam  Labs: UA, CBC, HcG  Assessment and Plan  38 year old G3P1011 at 10.1 weeks by LMP Vaginal Bleeding  -Exam findings discussed -Vaginal/Cervical contents sent to pathology -Will give Tramadol for pain -Send for [place]  Follow Up (6:28 PM)  -Preliminary US reveals no IUG. -HCG with drop from 29,712 to 15,414 -Results discussed with patient -Condolences given. -Informed of need for follow up quant and provider visit in 2 weeks. -Will send message to staff at [facility] -Bleeding expectations and precautions given. -Instructed to wait at least 2 normal menstrual cycles before trying to conceive.  -Pelvic Rest x 6 weeks, but otherwise okay to return to normal activities. -Informed that okay to take tylenol and/or ibuprofen for pain if needed. -No other questions or concerns. -Encouraged to call or return to MAU if symptoms worsen or with the onset of new symptoms. -Discharged to home in stable condition   Cherre Robins MSN, CNM 10/06/2018, 5:08 PM

## 2018-10-06 NOTE — MAU Note (Signed)
Pt presents to MAU with complaints of an increase in vaginal bleeding that started earlier today. Was evaluated last week and was told her U/S was behind on dates. Lower abdominal cramping

## 2018-10-12 ENCOUNTER — Ambulatory Visit (HOSPITAL_COMMUNITY): Payer: Managed Care, Other (non HMO)

## 2018-10-14 ENCOUNTER — Encounter: Payer: Managed Care, Other (non HMO) | Admitting: Family Medicine

## 2018-10-20 ENCOUNTER — Encounter: Payer: Self-pay | Admitting: Obstetrics & Gynecology

## 2018-10-20 ENCOUNTER — Other Ambulatory Visit: Payer: Self-pay

## 2018-10-20 ENCOUNTER — Telehealth (INDEPENDENT_AMBULATORY_CARE_PROVIDER_SITE_OTHER): Payer: Managed Care, Other (non HMO) | Admitting: Obstetrics & Gynecology

## 2018-10-20 DIAGNOSIS — O039 Complete or unspecified spontaneous abortion without complication: Secondary | ICD-10-CM | POA: Diagnosis not present

## 2018-10-20 DIAGNOSIS — F439 Reaction to severe stress, unspecified: Secondary | ICD-10-CM

## 2018-10-20 NOTE — Patient Instructions (Signed)
Make appt for 4 weeks for blood draw and possible IUD insertion

## 2018-10-20 NOTE — Progress Notes (Signed)
TELEHEALTH VIRTUAL GYNECOLOGY VISIT ENCOUNTER NOTE  I connected with Summer Shaffer on 10/20/18 at  9:30 AM EDT by Webex at home and verified that I am speaking with the correct person using two identifiers.   I discussed the limitations, risks, security and privacy concerns of performing an evaluation and management service by telephone and the availability of in person appointments. I also discussed with the patient that there may be a patient responsible charge related to this service. The patient expressed understanding and agreed to proceed.   History:  Summer Shaffer is a 38 y.o. 843P1011 female being evaluated today for follow up after a complete miscarriage 2 weeks ago. She reports less bleeding, has not had sex. The only problems are headaches which are relieved with IBU and feeling greatly stressed. She has a h/o auditory hallucinations with her last micarriage. She denies any symptoms of psychosis and denies HI and SI.      Past Medical History:  Diagnosis Date  . Allergy   . Pregnancy induced hypertension   . Vitamin D deficiency    Past Surgical History:  Procedure Laterality Date  . TONSILLECTOMY AND ADENOIDECTOMY     The following portions of the patient's history were reviewed and updated as appropriate: allergies, current medications, past family history, past medical history, past social history, past surgical history and problem list.     Review of Systems:  Pertinent items noted in HPI and remainder of comprehensive ROS otherwise negative.  Physical Exam:   General:  Alert, oriented and cooperative.   Mental Status: Normal mood and affect perceived. Normal judgment and thought content.  Physical exam deferred due to nature of the encounter  Labs and Imaging Results for orders placed or performed during the hospital encounter of 10/06/18 (from the past 336 hour(s))  CBC   Collection Time: 10/06/18  5:07 PM  Result Value Ref Range   WBC 8.6 4.0 - 10.5 K/uL    RBC 3.97 3.87 - 5.11 MIL/uL   Hemoglobin 11.6 (L) 12.0 - 15.0 g/dL   HCT 40.934.2 (L) 81.136.0 - 91.446.0 %   MCV 86.1 80.0 - 100.0 fL   MCH 29.2 26.0 - 34.0 pg   MCHC 33.9 30.0 - 36.0 g/dL   RDW 78.211.7 95.611.5 - 21.315.5 %   Platelets 275 150 - 400 K/uL   nRBC 0.0 0.0 - 0.2 %  hCG, quantitative, pregnancy   Collection Time: 10/06/18  5:07 PM  Result Value Ref Range   hCG, Beta Chain, Quant, S 15,414 (H) <5 mIU/mL   [place] Ob Transvaginal  Result Date: 10/06/2018 CLINICAL DATA:  Initial evaluation for vaginal bleeding, early pregnancy. EXAM: OBSTETRIC <14 WK US AND TRANSVAGINAL OB US TECHNIQUE: Both transabdominal and transvaginal ultrasound examinations were performed for complete evaluation of the gestation as well as the maternal uterus, adnexal regions, and pelvic cul-de-sac. Transvaginal technique was performed to assess early pregnancy. COMPARISON:  Prior ultrasound from 09/29/2018. FINDINGS: Intrauterine gestational sac: Previously seen small intrauterine gestational sac is no longer visualized. Associated vascularity seen within the endometrial stripe. Yolk sac:  Negative. Embryo:  Negative. Cardiac Activity: N/A Heart Rate: Insert in a bpm Subchorionic hemorrhage:  None visualized. Maternal uterus/adnexae: Ovaries are normal in appearance bilaterally. Small corpus luteal cyst noted on the right. No free fluid within the pelvis. IMPRESSION: 1. Nonvisualization of previously seen intrauterine gestational sac, compatible with interval abortion/miscarriage. Persistent vascularity within the endometrium could reflect retained products of conception. Clinical correlation recommended. 2. No other acute maternal  uterine or adnexal abnormality identified. Electronically Signed   By: Rise Mu M.D.   On: 10/06/2018 18:50   US Ob Less Than 14 Weeks With Ob Transvaginal  Result Date: 09/29/2018 CLINICAL DATA:  38 year old pregnant female presents with abdominal pain and vaginal bleeding for 1 week. Quantitative  beta hCG unavailable at the time of this dictation. EDC by LMP: 05/03/2019, projecting to an expected gestational age of [redacted] weeks 1 day. EXAM: OBSTETRIC <14 WK [place] AND TRANSVAGINAL OB US TECHNIQUE: Both transabdominal and transvaginal ultrasound examinations were performed for complete evaluation of the gestation as well as the maternal uterus, adnexal regions, and pelvic cul-de-sac. Transvaginal technique was performed to assess early pregnancy. COMPARISON:  None. FINDINGS: Intrauterine gestational sac: Single intrauterine gestational sac is normal in position and contour. Yolk sac:  Visualized. Embryo:  Visualized. Embryonic Cardiac Activity: Not Visualized. CRL:  1.4 mm   out of range (less than 5 weeks) Subchorionic hemorrhage: Small perigestational bleed suggested in the lower cavity involving less than 30% of the gestational sac circumference. Maternal uterus/adnexae: Retroverted uterus with no uterine fibroids. Right ovary measures 4.1 x 2.1 x 2.9 cm and contains a corpus luteum. Left ovary measures 3.8 x 2.0 x 1.5 cm. No abnormal ovarian or adnexal masses. No abnormal free fluid in the pelvis. IMPRESSION: 1. Single intrauterine gestational sac with yolk sac and tiny 1 mm embryonic pole, measuring less than [redacted] weeks gestational age, which is discordant with the expected gestational age of [redacted] weeks 1 day by provided menstrual dates. No embryonic cardiac activity. Small perigestational bleed. Findings are suspicious but not yet definitive for nonviable pregnancy. Recommend follow-up US in 11-14 days for definitive diagnosis. This recommendation follows SRU consensus guidelines: Diagnostic Criteria for Nonviable Pregnancy Early in the First Trimester. Malva Limes Med 2013; 157:2620-35. 2. No ovarian or adnexal abnormality.  No free fluid in the pelvis. Electronically Signed   By: Delbert Phenix M.D.   On: 09/29/2018 14:44      Assessment and Plan:     1. Miscarriage  - B-HCG Quant in 4 weeks  2. Stress -  Ambulatory referral to Psychiatry  3.contraception- rec IUD since she probably doesn't want any more kids     4. Anemia- rec iron and MVI daily  I discussed the assessment and treatment plan with the patient. The patient was provided an opportunity to ask questions and all were answered. The patient agreed with the plan and demonstrated an understanding of the instructions.   The patient was advised to call back or seek an in-person evaluation/go to the ED if the symptoms worsen or if the condition fails to improve as anticipated.  I provided 20 minutes of non-face-to-face time during this encounter.   Allie Bossier, MD [facility] [employer], [facility]

## 2018-11-17 ENCOUNTER — Ambulatory Visit (INDEPENDENT_AMBULATORY_CARE_PROVIDER_SITE_OTHER): Payer: Managed Care, Other (non HMO) | Admitting: Clinical

## 2018-11-17 ENCOUNTER — Other Ambulatory Visit: Payer: Self-pay

## 2018-11-17 ENCOUNTER — Other Ambulatory Visit: Payer: Managed Care, Other (non HMO)

## 2018-11-17 DIAGNOSIS — F4323 Adjustment disorder with mixed anxiety and depressed mood: Secondary | ICD-10-CM | POA: Diagnosis not present

## 2018-11-17 DIAGNOSIS — F4321 Adjustment disorder with depressed mood: Secondary | ICD-10-CM | POA: Diagnosis not present

## 2018-11-17 DIAGNOSIS — O039 Complete or unspecified spontaneous abortion without complication: Secondary | ICD-10-CM

## 2018-11-17 NOTE — Addendum Note (Signed)
Addended by: Cheree Ditto, Christan Defranco A on: 11/17/2018 09:03 AM   Modules accepted: Orders

## 2018-11-17 NOTE — BH Specialist Note (Signed)
Attempt to call patient to set up Webex video visit  Integrated Behavioral Health Initial Visit   MRN: [id number] Name: Summer Shaffer  Number of Integrated Behavioral Health Clinician visits:: 1/6 Session Start time: 3:43  Session End time: 4:49 Total time: 1 hour  Type of Service: Integrated Behavioral Health- Individual/Family Interpretor:No. Interpretor Name and Language: n/a   Warm Hand Off Completed.       SUBJECTIVE: Summer Shaffer is a 38 y.o. female accompanied by n/a Patient was referred by Nicholaus Bloom, MD for stress after miscarriage. Patient reports the following symptoms/concerns: Pt states her primary symptoms are sleep difficulty, lack of concentration, anxiety, excessive worry, irritability, along with difficulty relaxing and dread, and "brief moments" of feeling down, that have all escalated after having a miscarriage at 16wks. Pt is no longer having scary dreams and "weird symptoms, like hearing a baby cry" as happened after 4yo daughters birth and after this miscarriage. Pt requests self-coping strategies, to help manage work and life stress during this time.  Duration of problem: Over one month; Severity of problem: moderately severe  OBJECTIVE: Mood: Anxious and Affect: Appropriate Risk of harm to self or others: No plan to harm self or others  LIFE CONTEXT: Family and Social: Pt lives with her husband and 4yo daughter School/Work: Surveyor, mining Self-Care: Recognizing a greater need for self-care Life Changes: Recent miscarriage, in midst of pandemic (increase in work hours)  GOALS ADDRESSED: Patient will: 1. Reduce symptoms of: anxiety, depression and stress 2. Increase knowledge and/or ability of: coping skills, healthy habits and stress reduction  3. Demonstrate ability to: Increase healthy adjustment to current life circumstances, Increase adequate support systems for patient/family and Begin healthy grieving over  loss  INTERVENTIONS: Interventions utilized: Solution-Focused Strategies, Mindfulness or Management consultant, Psychoeducation and/or Health Education and Link to [employer]  Standardized Assessments completed: GAD-7 and PHQ 9  ASSESSMENT: Patient currently experiencing Adjustment disorder with mixed anxious and depressed mood and Grief   Patient may benefit from psychoeducation and brief therapeutic interventions regarding coping with symptoms of anxiety and depression related to both grief and life stress .  PLAN: 1. Follow up with behavioral health clinician on : Two weeks 2. Behavioral recommendations:  -Continue taking medication as recommended and prescribed by medical providers; begin using orange juice (instead of soda) with iron pills -CALM relaxation breathing exercise twice daily (morning; at bedtime) -Begin using sleep sounds at bedtime for improved family sleep -Begin Worry Time strategy tomorrow, 11/18/18; continue once/day for as long as remains helpful  3. Referral(s): Integrated Art gallery manager (In Clinic) and Community Resources:  Postpartum support 4. "From scale of 1-10, how likely are you to follow plan?": 10  Jamie C McMannes, LCSW  Type of Visit: Telephonic, after "low bandwidth" issues on video visit Patient location: Home [facility] Provider location: [facility] All persons participating in visit: Patient Data processing manager and [facility] Jamie McMannes  Confirmed patient's address: Yes  Confirmed patient's phone number: Yes  Any changes to demographics: No   Confirmed patient's insurance: Yes  Any changes to patient's insurance: No   Discussed confidentiality: No    The following statements were read to the patient and/or legal guardian that are established with the [facility] Provider.  "The purpose of this phone visit is to provide behavioral health care while limiting exposure to the coronavirus (COVID19).  There is a possibility of technology  failure and discussed alternative modes of communication if that failure occurs."  "By engaging in this telephone visit,  you consent to the provision of healthcare.  Additionally, you authorize for your insurance to be billed for the services provided during this telephone visit."   Patient and/or legal guardian consented to telephone visit: Yes   STRENGTHS (Protective Factors/Coping Skills): Supportive family; high self-awareness  Rae LipsJamie C McMannes

## 2018-11-18 LAB — BETA HCG QUANT (REF LAB): hCG Quant: <1 m[IU]/mL

## 2018-11-26 ENCOUNTER — Other Ambulatory Visit: Payer: Self-pay

## 2018-11-30 ENCOUNTER — Ambulatory Visit (INDEPENDENT_AMBULATORY_CARE_PROVIDER_SITE_OTHER): Payer: Managed Care, Other (non HMO) | Admitting: Obstetrics & Gynecology

## 2018-11-30 ENCOUNTER — Encounter: Payer: Self-pay | Admitting: *Deleted

## 2018-11-30 ENCOUNTER — Telehealth: Payer: Self-pay | Admitting: Internal Medicine

## 2018-11-30 ENCOUNTER — Other Ambulatory Visit: Payer: Self-pay

## 2018-11-30 VITALS — BP 129/83 | HR 80 | Wt 177.0 lb

## 2018-11-30 DIAGNOSIS — N898 Other specified noninflammatory disorders of vagina: Secondary | ICD-10-CM | POA: Diagnosis not present

## 2018-11-30 DIAGNOSIS — Z30014 Encounter for initial prescription of intrauterine contraceptive device: Secondary | ICD-10-CM

## 2018-11-30 DIAGNOSIS — N760 Acute vaginitis: Secondary | ICD-10-CM | POA: Diagnosis not present

## 2018-11-30 DIAGNOSIS — B9689 Other specified bacterial agents as the cause of diseases classified elsewhere: Secondary | ICD-10-CM | POA: Diagnosis not present

## 2018-11-30 DIAGNOSIS — Z113 Encounter for screening for infections with a predominantly sexual mode of transmission: Secondary | ICD-10-CM

## 2018-11-30 DIAGNOSIS — Z3202 Encounter for pregnancy test, result negative: Secondary | ICD-10-CM

## 2018-11-30 DIAGNOSIS — Z01419 Encounter for gynecological examination (general) (routine) without abnormal findings: Secondary | ICD-10-CM

## 2018-11-30 DIAGNOSIS — Z1151 Encounter for screening for human papillomavirus (HPV): Secondary | ICD-10-CM | POA: Diagnosis not present

## 2018-11-30 DIAGNOSIS — Z124 Encounter for screening for malignant neoplasm of cervix: Secondary | ICD-10-CM | POA: Diagnosis not present

## 2018-11-30 LAB — POCT URINE PREGNANCY: Preg Test, Ur: NEGATIVE

## 2018-11-30 MED ORDER — METRONIDAZOLE 500 MG PO TABS: 500.0000 mg | ORAL_TABLET | Freq: Two times a day (BID) | ORAL | 0 refills | Status: DC

## 2018-11-30 MED ORDER — LEVONORGESTREL 19.5 MCG/DAY IU IUD
INTRAUTERINE_SYSTEM | Freq: Once | INTRAUTERINE | Status: AC
  Administered 2018-11-30: 14:00:00 via INTRAUTERINE

## 2018-11-30 NOTE — Progress Notes (Signed)
Subjective:    Summer Shaffer is a 38 y.o. married P1 (38 yo daughter- Ivor MessierCora) female who presents for an annual exam. The patient has no complaints today. The patient is sexually active. GYN screening history: last pap: was normal. The patient wears seatbelts: yes. The patient participates in regular exercise: yes. Has the patient ever been transfused or tattooed?: no. The patient reports that there is not domestic violence in her life.   Menstrual History: OB History    Gravida  3   Para  1   Term  1   Preterm      AB  2   Living  1     SAB  2   TAB      Ectopic      Multiple      Live Births  1           Menarche age: 89 Patient's last menstrual period was 11/06/2018 (approximate).    The following portions of the patient's history were reviewed and updated as appropriate: allergies, current medications, past family history, past medical history, past social history, past surgical history and problem list.  Review of Systems Pertinent items are noted in HPI.   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   Objective:    BP 129/83   Pulse 80   Wt 177 lb (80.3 kg)   LMP 11/06/2018 (Approximate)   BMI 30.38 kg/m   General Appearance:    Alert, cooperative, no distress, appears stated age  Head:    Normocephalic, without obvious abnormality, atraumatic  Eyes:    PERRL, conjunctiva/corneas clear, EOM's intact, fundi    benign, both eyes  Ears:    Normal TM's and external ear canals, both ears  Nose:   Nares normal, septum midline, mucosa normal, no drainage    or sinus tenderness  Throat:   Lips, mucosa, and tongue normal; teeth and gums normal  Neck:   Supple, symmetrical, trachea midline, no adenopathy;    thyroid:  no enlargement/tenderness/nodules; no carotid   bruit or JVD  Back:     Symmetric, no curvature, ROM normal, no CVA tenderness   Lungs:     Clear to auscultation bilaterally, respirations unlabored  Chest Wall:    No tenderness or deformity   Heart:    Regular rate and rhythm, S1 and S2 normal, no murmur, rub   or gallop  Breast Exam:    No tenderness, masses, or nipple abnormality  Abdomen:     Soft, non-tender, bowel sounds active all four quadrants,    no masses, no organomegaly  Genitalia:    Normal female without lesion or tenderness Discharge c/w BV     Extremities:   Extremities normal, atraumatic, no cyanosis or edema  Pulses:   2+ and symmetric all extremities  Skin:   Skin color, texture, turgor normal, no rashes or lesions  Lymph nodes:   Cervical, supraclavicular, and axillary nodes normal  Neurologic:   CNII-XII intact, normal strength, sensation and reflexes    throughout  .   UPT negative, consent signed, Time out procedure done. It has been more than 2 weeks since she has had sex. Cervix prepped with betadine and grasped with a single tooth tenaculum. Liletta was easily placed and the strings were cut to 3-4 cm. Uterus sounded to 9 cm. She tolerated the procedure well.   Assessment:  Healthy female exam.   Contraception Vaginal discharge   Plan:     Thin prep Pap smear. with cotesting Wet prep sent Flagyl prescribed

## 2018-11-30 NOTE — Telephone Encounter (Signed)
Showing in chart reconcile 12/2014, but not in [facility]... please advise

## 2018-11-30 NOTE — Progress Notes (Signed)
Would like to get a PAP today, last one was in 2017 And and IUD placed

## 2018-11-30 NOTE — Telephone Encounter (Signed)
Can we call and see if she remembers getting one in 2016? If so, she doesn't need another one.
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
## 2018-11-30 NOTE — Telephone Encounter (Signed)
Patient put in an appointment request for tetanus. Is this okay to schedule?

## 2018-12-01 ENCOUNTER — Ambulatory Visit (INDEPENDENT_AMBULATORY_CARE_PROVIDER_SITE_OTHER): Payer: Managed Care, Other (non HMO) | Admitting: Clinical

## 2018-12-01 DIAGNOSIS — F4323 Adjustment disorder with mixed anxiety and depressed mood: Secondary | ICD-10-CM

## 2018-12-01 LAB — CYTOLOGY - PAP
Diagnosis: NEGATIVE
HPV: NOT DETECTED

## 2018-12-01 LAB — CERVICOVAGINAL ANCILLARY ONLY
Bacterial vaginitis: POSITIVE — AB
Candida vaginitis: NEGATIVE
Trichomonas: NEGATIVE

## 2018-12-07 ENCOUNTER — Other Ambulatory Visit: Payer: Self-pay

## 2018-12-08 ENCOUNTER — Ambulatory Visit: Payer: Managed Care, Other (non HMO)

## 2018-12-17 NOTE — Telephone Encounter (Signed)
Left message on voicemail.

## 2018-12-22 ENCOUNTER — Other Ambulatory Visit: Payer: Self-pay

## 2018-12-22 ENCOUNTER — Ambulatory Visit (INDEPENDENT_AMBULATORY_CARE_PROVIDER_SITE_OTHER): Payer: Managed Care, Other (non HMO) | Admitting: Clinical

## 2018-12-22 DIAGNOSIS — F4323 Adjustment disorder with mixed anxiety and depressed mood: Secondary | ICD-10-CM | POA: Diagnosis not present
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
## 2018-12-29 ENCOUNTER — Ambulatory Visit: Payer: Managed Care, Other (non HMO) | Admitting: Family Medicine

## 2019-01-21 DIAGNOSIS — M5416 Radiculopathy, lumbar region: Secondary | ICD-10-CM | POA: Diagnosis not present

## 2019-01-21 DIAGNOSIS — M9905 Segmental and somatic dysfunction of pelvic region: Secondary | ICD-10-CM | POA: Diagnosis not present

## 2019-01-21 DIAGNOSIS — M9903 Segmental and somatic dysfunction of lumbar region: Secondary | ICD-10-CM | POA: Diagnosis not present

## 2019-01-21 DIAGNOSIS — M955 Acquired deformity of pelvis: Secondary | ICD-10-CM | POA: Diagnosis not present

## 2019-01-27 DIAGNOSIS — M955 Acquired deformity of pelvis: Secondary | ICD-10-CM | POA: Diagnosis not present

## 2019-01-27 DIAGNOSIS — M5416 Radiculopathy, lumbar region: Secondary | ICD-10-CM | POA: Diagnosis not present

## 2019-01-27 DIAGNOSIS — M9905 Segmental and somatic dysfunction of pelvic region: Secondary | ICD-10-CM | POA: Diagnosis not present

## 2019-01-27 DIAGNOSIS — M9903 Segmental and somatic dysfunction of lumbar region: Secondary | ICD-10-CM | POA: Diagnosis not present

## 2019-01-29 DIAGNOSIS — M9903 Segmental and somatic dysfunction of lumbar region: Secondary | ICD-10-CM | POA: Diagnosis not present

## 2019-01-29 DIAGNOSIS — M5416 Radiculopathy, lumbar region: Secondary | ICD-10-CM | POA: Diagnosis not present

## 2019-01-29 DIAGNOSIS — M9905 Segmental and somatic dysfunction of pelvic region: Secondary | ICD-10-CM | POA: Diagnosis not present

## 2019-01-29 DIAGNOSIS — M955 Acquired deformity of pelvis: Secondary | ICD-10-CM | POA: Diagnosis not present

## 2019-02-01 DIAGNOSIS — M9903 Segmental and somatic dysfunction of lumbar region: Secondary | ICD-10-CM | POA: Diagnosis not present

## 2019-02-01 DIAGNOSIS — M955 Acquired deformity of pelvis: Secondary | ICD-10-CM | POA: Diagnosis not present

## 2019-02-01 DIAGNOSIS — M5416 Radiculopathy, lumbar region: Secondary | ICD-10-CM | POA: Diagnosis not present

## 2019-02-01 DIAGNOSIS — M9905 Segmental and somatic dysfunction of pelvic region: Secondary | ICD-10-CM | POA: Diagnosis not present

## 2019-02-04 DIAGNOSIS — M9903 Segmental and somatic dysfunction of lumbar region: Secondary | ICD-10-CM | POA: Diagnosis not present

## 2019-02-04 DIAGNOSIS — M9905 Segmental and somatic dysfunction of pelvic region: Secondary | ICD-10-CM | POA: Diagnosis not present

## 2019-02-04 DIAGNOSIS — M5416 Radiculopathy, lumbar region: Secondary | ICD-10-CM | POA: Diagnosis not present

## 2019-02-04 DIAGNOSIS — M955 Acquired deformity of pelvis: Secondary | ICD-10-CM | POA: Diagnosis not present

## 2019-02-08 DIAGNOSIS — M9903 Segmental and somatic dysfunction of lumbar region: Secondary | ICD-10-CM | POA: Diagnosis not present

## 2019-02-08 DIAGNOSIS — M955 Acquired deformity of pelvis: Secondary | ICD-10-CM | POA: Diagnosis not present

## 2019-02-08 DIAGNOSIS — M5416 Radiculopathy, lumbar region: Secondary | ICD-10-CM | POA: Diagnosis not present

## 2019-02-08 DIAGNOSIS — M9905 Segmental and somatic dysfunction of pelvic region: Secondary | ICD-10-CM | POA: Diagnosis not present

## 2019-05-11 ENCOUNTER — Ambulatory Visit (INDEPENDENT_AMBULATORY_CARE_PROVIDER_SITE_OTHER): Payer: BC Managed Care – PPO | Admitting: Internal Medicine

## 2019-05-11 ENCOUNTER — Encounter: Payer: Self-pay | Admitting: Internal Medicine

## 2019-05-11 ENCOUNTER — Other Ambulatory Visit: Payer: Self-pay

## 2019-05-11 VITALS — BP 128/84 | HR 68 | Temp 98.2°F | Ht 64.0 in | Wt 181.0 lb

## 2019-05-11 DIAGNOSIS — F419 Anxiety disorder, unspecified: Secondary | ICD-10-CM | POA: Diagnosis not present

## 2019-05-11 DIAGNOSIS — Z0000 Encounter for general adult medical examination without abnormal findings: Secondary | ICD-10-CM

## 2019-05-11 DIAGNOSIS — F5104 Psychophysiologic insomnia: Secondary | ICD-10-CM

## 2019-05-11 DIAGNOSIS — Z0001 Encounter for general adult medical examination with abnormal findings: Secondary | ICD-10-CM | POA: Diagnosis not present

## 2019-05-11 DIAGNOSIS — L989 Disorder of the skin and subcutaneous tissue, unspecified: Secondary | ICD-10-CM | POA: Diagnosis not present

## 2019-05-11 LAB — COMPREHENSIVE METABOLIC PANEL
ALT: 12 U/L (ref 0–35)
AST: 14 U/L (ref 0–37)
Albumin: 4.3 g/dL (ref 3.5–5.2)
Alkaline Phosphatase: 59 U/L (ref 39–117)
BUN: 6 mg/dL (ref 6–23)
CO2: 26 mEq/L (ref 19–32)
Calcium: 8.7 mg/dL (ref 8.4–10.5)
Chloride: 104 mEq/L (ref 96–112)
Creatinine, Ser: 0.65 mg/dL (ref 0.40–1.20)
GFR: 101.74 mL/min (ref >60.00)
Glucose, Bld: 100 mg/dL — ABNORMAL HIGH (ref 70–99)
Potassium: 3.5 mEq/L (ref 3.5–5.1)
Sodium: 136 mEq/L (ref 135–145)
Total Bilirubin: 0.7 mg/dL (ref 0.2–1.2)
Total Protein: 7.8 g/dL (ref 6.0–8.3)

## 2019-05-11 LAB — CBC
HCT: 38.5 % (ref 36.0–46.0)
Hemoglobin: 12.9 g/dL (ref 12.0–15.0)
MCHC: 33.6 g/dL (ref 30.0–36.0)
MCV: 88.3 fl (ref 78.0–100.0)
Platelets: 299 10*3/uL (ref 150.0–400.0)
RBC: 4.35 Mil/uL (ref 3.87–5.11)
RDW: 12.5 % (ref 11.5–15.5)
WBC: 7.1 10*3/uL (ref 4.0–10.5)

## 2019-05-11 LAB — LIPID PANEL
Cholesterol: 138 mg/dL (ref 0–200)
HDL: 44.5 mg/dL (ref >39.00)
LDL Cholesterol: 83 mg/dL (ref 0–99)
NonHDL: 93.72
Total CHOL/HDL Ratio: 3
Triglycerides: 52 mg/dL (ref 0.0–149.0)
VLDL: 10.4 mg/dL (ref 0.0–40.0)

## 2019-05-11 LAB — VITAMIN D 25 HYDROXY (VIT D DEFICIENCY, FRACTURES): VITD: 24.81 ng/mL — ABNORMAL LOW (ref 30.00–100.00)

## 2019-05-11 LAB — TSH: TSH: 1.94 u[IU]/mL (ref 0.35–4.50)

## 2019-05-11 MED ORDER — TRAZODONE HCL 50 MG PO TABS: 25.0000 mg | ORAL_TABLET | Freq: Every evening | ORAL | 0 refills | Status: DC | PRN

## 2019-05-11 NOTE — Assessment & Plan Note (Signed)
Secondary to anxiety Will trial Trazadone- sedation caution given Update me in 4 weeks and let me know how you are doing

## 2019-05-11 NOTE — Assessment & Plan Note (Signed)
Situational Support offered today Encouraged her to reach out to her therapist

## 2019-05-11 NOTE — Patient Instructions (Signed)
Health Maintenance, Female Adopting a healthy lifestyle and getting preventive care are important in promoting health and wellness. Ask your health care provider about:  The right schedule for you to have regular tests and exams.  Things you can do on your own to prevent diseases and keep yourself healthy. What should I know about diet, weight, and exercise? Eat a healthy diet   Eat a diet that includes plenty of vegetables, fruits, low-fat dairy products, and lean protein.  Do not eat a lot of foods that are high in solid fats, added sugars, or sodium. Maintain a healthy weight Body mass index (BMI) is used to identify weight problems. It estimates body fat based on height and weight. Your health care provider can help determine your BMI and help you achieve or maintain a healthy weight. Get regular exercise Get regular exercise. This is one of the most important things you can do for your health. Most adults should:  Exercise for at least 150 minutes each week. The exercise should increase your heart rate and make you sweat (moderate-intensity exercise).  Do strengthening exercises at least twice a week. This is in addition to the moderate-intensity exercise.  Spend less time sitting. Even light physical activity can be beneficial. Watch cholesterol and blood lipids Have your blood tested for lipids and cholesterol at 38 years of age, then have this test every 5 years. Have your cholesterol levels checked more often if:  Your lipid or cholesterol levels are high.  You are older than 38 years of age.  You are at high risk for heart disease. What should I know about cancer screening? Depending on your health history and family history, you may need to have cancer screening at various ages. This may include screening for:  Breast cancer.  Cervical cancer.  Colorectal cancer.  Skin cancer.  Lung cancer. What should I know about heart disease, diabetes, and high blood  pressure? Blood pressure and heart disease  High blood pressure causes heart disease and increases the risk of stroke. This is more likely to develop in people who have high blood pressure readings, are of African descent, or are overweight.  Have your blood pressure checked: ? Every 3-5 years if you are 18-39 years of age. ? Every year if you are 40 years old or older. Diabetes Have regular diabetes screenings. This checks your fasting blood sugar level. Have the screening done:  Once every three years after age 40 if you are at a normal weight and have a low risk for diabetes.  More often and at a younger age if you are overweight or have a high risk for diabetes. What should I know about preventing infection? Hepatitis B If you have a higher risk for hepatitis B, you should be screened for this virus. Talk with your health care provider to find out if you are at risk for hepatitis B infection. Hepatitis C Testing is recommended for:  Everyone born from 1945 through 1965.  Anyone with known risk factors for hepatitis C. Sexually transmitted infections (STIs)  Get screened for STIs, including gonorrhea and chlamydia, if: ? You are sexually active and are younger than 38 years of age. ? You are older than 38 years of age and your health care provider tells you that you are at risk for this type of infection. ? Your sexual activity has changed since you were last screened, and you are at increased risk for chlamydia or gonorrhea. Ask your health care provider if   you are at risk.  Ask your health care provider about whether you are at high risk for HIV. Your health care provider may recommend a prescription medicine to help prevent HIV infection. If you choose to take medicine to prevent HIV, you should first get tested for HIV. You should then be tested every 3 months for as long as you are taking the medicine. Pregnancy  If you are about to stop having your period (premenopausal) and  you may become pregnant, seek counseling before you get pregnant.  Take 400 to 800 micrograms (mcg) of folic acid every day if you become pregnant.  Ask for birth control (contraception) if you want to prevent pregnancy. Osteoporosis and menopause Osteoporosis is a disease in which the bones lose minerals and strength with aging. This can result in bone fractures. If you are 65 years old or older, or if you are at risk for osteoporosis and fractures, ask your health care provider if you should:  Be screened for bone loss.  Take a calcium or vitamin D supplement to lower your risk of fractures.  Be given hormone replacement therapy (HRT) to treat symptoms of menopause. Follow these instructions at home: Lifestyle  Do not use any products that contain nicotine or tobacco, such as cigarettes, e-cigarettes, and chewing tobacco. If you need help quitting, ask your health care provider.  Do not use street drugs.  Do not share needles.  Ask your health care provider for help if you need support or information about quitting drugs. Alcohol use  Do not drink alcohol if: ? Your health care provider tells you not to drink. ? You are pregnant, may be pregnant, or are planning to become pregnant.  If you drink alcohol: ? Limit how much you use to 0-1 drink a day. ? Limit intake if you are breastfeeding.  Be aware of how much alcohol is in your drink. In the [place] one drink equals one 12 oz bottle of beer (355 mL), one 5 oz glass of wine (148 mL), or one 1 oz glass of hard liquor (44 mL). General instructions  Schedule regular health, dental, and eye exams.  Stay current with your vaccines.  Tell your health care provider if: ? You often feel depressed. ? You have ever been abused or do not feel safe at home. Summary  Adopting a healthy lifestyle and getting preventive care are important in promoting health and wellness.  Follow your health care provider's instructions about healthy  diet, exercising, and getting tested or screened for diseases.  Follow your health care provider's instructions on monitoring your cholesterol and blood pressure. This information is not intended to replace advice given to you by your health care provider. Make sure you discuss any questions you have with your health care provider. Document Released: 12/31/2010 Document Revised: 06/10/2018 Document Reviewed: 06/10/2018 Elsevier Patient Education  2020 [employer]  

## 2019-05-11 NOTE — Progress Notes (Signed)
Subjective:    Patient ID: Summer Shaffer, female    DOB: 1980-11-30, 38 y.o.   MRN: [id number]  HPI  Pt presents to the clinic today for her annual exam.   Environmental Allergies: No new issues. Controlled on daily Claritin and Flonase.  Patient mentions she previously had a bout of anxiety for which she saw a therapist for a couple of weeks. Now, she complains of continued anxiety and lack of sleep. She has tried Melatonin but has not helped as much. She usually goes to sleep around 8:30-9pm and wakes up around 2-3 am. She would like to get something to help her with sleep.  Flu: 05/2019 Tetanus: 12/2014  Pap Smear: 11/2018 Vision: Annually Dentist: Biannually   Diet: She consumes meats. She eats fruits and veggies daily. She also eats fried foods. She mostly drinks water and occasionally Dr Malachi Bonds.  Exercise: She walks about 30 min X 3 days a week.     Review of Systems      Past Medical History:  Diagnosis Date  . Allergy   . Pregnancy induced hypertension   . Vitamin D deficiency     Current Outpatient Medications  Medication Sig Dispense Refill  . Cholecalciferol (VITAMIN D3) 1000 units CAPS Take 1 capsule by mouth daily.    . ferrous sulfate 325 (65 FE) MG EC tablet Take 325 mg by mouth 3 (three) times daily with meals.    . fluticasone (FLONASE) 50 MCG/ACT nasal spray Place into both nostrils daily.    Marland Kitchen loratadine (CLARITIN) 10 MG tablet Take by mouth.    . metroNIDAZOLE (FLAGYL) 500 MG tablet Take 1 tablet (500 mg total) by mouth 2 (two) times daily. 14 tablet 0   No current facility-administered medications for this visit.     Allergies  Allergen Reactions  . Codeine Other (See Comments)    Hallucinations  . Penicillins Rash    AS A BABY     Family History  Problem Relation Age of Onset  . Diabetes Mother   . Hypertension Father   . Thyroid disease Sister   . Hypertension Sister   . Thyroid disease Maternal Grandmother   . Skin cancer  Maternal Grandmother   . Diabetes Maternal Grandfather   . Glaucoma Maternal Grandfather   . Diabetes Paternal Grandmother   . Skin cancer Paternal Grandfather     Social History   Socioeconomic History  . Marital status: Married    Spouse name: Not on file  . Number of children: Not on file  . Years of education: Not on file  . Highest education level: Not on file  Occupational History  . Not on file  Social Needs  . Financial resource strain: Not on file  . Food insecurity    Worry: Not on file    Inability: Not on file  . Transportation needs    Medical: Not on file    Non-medical: Not on file  Tobacco Use  . Smoking status: Never Smoker  . Smokeless tobacco: Never Used  Substance and Sexual Activity  . Alcohol use: Not Currently    Comment: occasional  . Drug use: Never  . Sexual activity: Not on file  Lifestyle  . Physical activity    Days per week: Not on file    Minutes per session: Not on file  . Stress: Not on file  Relationships  . Social Herbalist on phone: Not on file    Gets together: Not  on file    Attends religious service: Not on file    Active member of club or organization: Not on file    Attends meetings of clubs or organizations: Not on file    Relationship status: Not on file  . Intimate partner violence    Fear of current or ex partner: Not on file    Emotionally abused: Not on file    Physically abused: Not on file    Forced sexual activity: Not on file  Other Topics Concern  . Not on file  Social History Narrative  . Not on file     Constitutional: Denies fever, malaise, fatigue, headache or abrupt weight changes.  HEENT: Denies eye pain, eye redness, ear pain, ringing in the ears, wax buildup, runny nose, nasal congestion, bloody nose, or sore throat. Respiratory: Denies difficulty breathing, shortness of breath, cough or sputum production.   Cardiovascular: Denies chest pain, chest tightness, palpitations or swelling in  the hands or feet.  Gastrointestinal: Denies abdominal pain, bloating, constipation, diarrhea or blood in the stool.  GU: Denies urgency, frequency, pain with urination, burning sensation, blood in urine, odor or discharge. Musculoskeletal: Denies decrease in range of motion, difficulty with gait, muscle pain or joint pain and swelling.  Skin: Denies redness, rashes, lesions or ulcercations.  Neurological: Pt reports insomnia. Denies dizziness, difficulty with memory, difficulty with speech or problems with balance and coordination.  Psych: Pt reports anxiety. Deneis depression, SI/HI.  No other specific complaints in a complete review of systems (except as listed in HPI above).  Objective:   Physical Exam  BP 128/84   Pulse 68   Temp 98.2 F (36.8 C) (Temporal)   Ht 5\' 4"  (1.626 m)   Wt 181 lb (82.1 kg)   SpO2 98%   BMI 31.07 kg/m   Wt Readings from Last 3 Encounters:  11/30/18 177 lb (80.3 kg)  10/06/18 177 lb 11.1 oz (80.6 kg)  09/29/18 177 lb 11.2 oz (80.6 kg)    General: Alert, appears her stated age, well developed, in NAD Skin: Warm, dry and intact. No rashes noted. HEENT: Head: normal shape and size; Eyes: sclera white, no icterus, conjunctiva pink and EOMs intact Neck:  Neck supple, trachea midline. No masses, lumps or thyromegaly present.  Cardiovascular: Normal rate and rhythm. S1,S2 noted.  No murmur, rubs or gallops noted. No JVD or BLE edema.  Pulmonary/Chest: Normal effort and positive vesicular breath sounds. No respiratory distress. No wheezes, rales or ronchi noted.  Abdomen: Soft and nontender. Normal bowel sounds. No distention or masses noted. Liver, spleen and kidneys non palpable. Musculoskeletal: Strength 5/5 BUE/BLE. No difficulty with gait.  Neurological: Alert and oriented. Cranial nerves II-XII grossly intact. Coordination normal.  Psychiatric: Tearful. Behavior is normal. Judgment and thought content normal.       Assessment & Plan:   Preventative Health Maintenance:  Flu shot today Tetanus UTD Pap smear UTD Encouraged patient to eat a healthy and balanced diet Encouraged patient to see a dentist annually Ordered CBC, CMP, Lipid panel, Vit D and TSH  Generalized Skin Lesion:  Pt requesting referral to dermatology for skin check, referral placed 10/01/18, NP   Pt to return in 1 year, sooner if needed

## 2019-05-28 ENCOUNTER — Telehealth: Payer: BC Managed Care – PPO | Admitting: Family

## 2019-05-28 DIAGNOSIS — N760 Acute vaginitis: Secondary | ICD-10-CM

## 2019-05-28 MED ORDER — FLUCONAZOLE 150 MG PO TABS: 150.0000 mg | ORAL_TABLET | Freq: Once | ORAL | 0 refills | Status: AC

## 2019-05-28 NOTE — Progress Notes (Signed)

## 2019-05-28 NOTE — Addendum Note (Signed)
Addended by: Dutch Quint B on: 05/28/2019 05:07 PM   Modules accepted: Orders

## 2019-06-02 ENCOUNTER — Other Ambulatory Visit: Payer: Self-pay | Admitting: Internal Medicine

## 2019-06-02 DIAGNOSIS — F5104 Psychophysiologic insomnia: Secondary | ICD-10-CM

## 2019-06-02 DIAGNOSIS — Z0000 Encounter for general adult medical examination without abnormal findings: Secondary | ICD-10-CM

## 2019-06-02 DIAGNOSIS — F419 Anxiety disorder, unspecified: Secondary | ICD-10-CM

## 2019-06-14 DIAGNOSIS — J029 Acute pharyngitis, unspecified: Secondary | ICD-10-CM | POA: Diagnosis not present

## 2019-06-14 DIAGNOSIS — J310 Chronic rhinitis: Secondary | ICD-10-CM | POA: Diagnosis not present

## 2019-06-14 DIAGNOSIS — Z03818 Encounter for observation for suspected exposure to other biological agents ruled out: Secondary | ICD-10-CM | POA: Diagnosis not present

## 2019-06-14 DIAGNOSIS — J329 Chronic sinusitis, unspecified: Secondary | ICD-10-CM | POA: Diagnosis not present

## 2019-07-04 ENCOUNTER — Other Ambulatory Visit: Payer: Self-pay | Admitting: Internal Medicine

## 2019-07-04 DIAGNOSIS — F5104 Psychophysiologic insomnia: Secondary | ICD-10-CM

## 2019-07-04 DIAGNOSIS — Z0000 Encounter for general adult medical examination without abnormal findings: Secondary | ICD-10-CM

## 2019-07-04 DIAGNOSIS — F419 Anxiety disorder, unspecified: Secondary | ICD-10-CM

## 2019-09-24 ENCOUNTER — Ambulatory Visit: Payer: BC Managed Care – PPO | Attending: Internal Medicine

## 2019-09-24 DIAGNOSIS — Z23 Encounter for immunization: Secondary | ICD-10-CM

## 2019-09-24 NOTE — Progress Notes (Signed)
   Covid-19 Vaccination Clinic  Name:  Summer Shaffer    MRN: [id number] DOB: 1980/10/04  09/24/2019  Ms. Summer Shaffer was observed post Covid-19 immunization for 30 minutes based on pre-vaccination screening without incident. She was provided with Vaccine Information Sheet and instruction to access the V-Safe system.   Ms. Summer Shaffer was instructed to call 911 with any severe reactions post vaccine: Marland Kitchen Difficulty breathing  . Swelling of face and throat  . A fast heartbeat  . A bad rash all over body  . Dizziness and weakness   Immunizations Administered    Name Date Dose VIS Date Route   Pfizer COVID-19 Vaccine 09/24/2019  9:54 AM 0.3 mL 06/11/2019 Intramuscular   Manufacturer: [employer], [employer]   Lot: [id number]   NDC: [address]-4

## 2019-10-19 ENCOUNTER — Ambulatory Visit: Payer: BC Managed Care – PPO | Attending: Internal Medicine

## 2019-10-19 DIAGNOSIS — Z23 Encounter for immunization: Secondary | ICD-10-CM

## 2019-10-19 NOTE — Progress Notes (Signed)
   Covid-19 Vaccination Clinic  Name:  Summer Shaffer    MRN: [id number] DOB: 1980-10-08  10/19/2019  Summer Shaffer was observed post Covid-19 immunization for 15 minutes without incident. She was provided with Vaccine Information Sheet and instruction to access the V-Safe system.   Summer Shaffer was instructed to call 911 with any severe reactions post vaccine: Marland Kitchen Difficulty breathing  . Swelling of face and throat  . A fast heartbeat  . A bad rash all over body  . Dizziness and weakness   Immunizations Administered    Name Date Dose VIS Date Route   Pfizer COVID-19 Vaccine 10/19/2019  8:41 AM 0.3 mL 08/25/2018 Intramuscular   Manufacturer: [employer], [employer]   Lot: [id number]   NDC: [address]-7

## 2020-05-03 ENCOUNTER — Telehealth: Payer: Self-pay

## 2020-05-03 NOTE — Telephone Encounter (Signed)
Copied from CRM [id number]. Topic: Appointment Scheduling - New Patient >> May 03, 2020  8:35 AM Elliot Gault wrote: Patient wanted to know if she can be worked in prior to her new patient appointment with Dr. Beryle Flock scheduled on 07/11/2019 (patient placed on wait list). Patient states mother is a patient of Dr. B.    Also, patient states she had a concussion a year ago and all was well and now she's starting to experience head aches. Patient will try to follow up with current PCP. Patient has new insurance [employer].

## 2020-05-03 NOTE — Telephone Encounter (Signed)
Please review. Thanks!  

## 2020-05-03 NOTE — Telephone Encounter (Signed)
Okay to use in a 40-minute slot as long as it is not back to back with another new patient.  From a quick glance, it looks like I do not have any 40-minute slots available until December 9, but that is a month earlier than her current new patient appointment, so we could use that.

## 2020-05-08 ENCOUNTER — Ambulatory Visit
Admission: RE | Admit: 2020-05-08 | Discharge: 2020-05-08 | Disposition: A | Discharge status: Home / Self Care | Payer: 59 | Source: Ambulatory Visit | Attending: Family Medicine | Admitting: Family Medicine

## 2020-05-08 VITALS — BP 134/86 | HR 60 | Temp 98.9°F

## 2020-05-08 DIAGNOSIS — G43811 Other migraine, intractable, with status migrainosus: Secondary | ICD-10-CM

## 2020-05-08 MED ORDER — RIZATRIPTAN BENZOATE 5 MG PO TABS: 5.0000 mg | ORAL_TABLET | ORAL | 0 refills | Status: DC | PRN

## 2020-05-08 NOTE — ED Provider Notes (Signed)
[facility]   [id number] 05/08/20 Arrival Time: 0936  CC: HEADACHE  SUBJECTIVE:  Keaisha Sublette is a 39 y.o. female who complains of headache for the last 10 days. Denies a precipitating event, or recent head trauma. Patient localizes pain to the temples and behind the eyes. Describes the pain as constant and throbbing in character. Patient has tried OTC excedrin without relief. Symptoms are made worse with having to focus on a computer screen. Reports similar symptoms in the past that improved with excedrin. This is not the worst headache of their life. Patient denies fever, chills, nausea, vomiting, aura, rhinorrhea, watery eyes, chest pain, SOB, abdominal pain, weakness, numbness or tingling, slurred speech.     ROS: As per HPI.  All other pertinent ROS negative.     Past Medical History:  Diagnosis Date  . Allergy   . Pregnancy induced hypertension   . Vitamin D deficiency    Past Surgical History:  Procedure Laterality Date  . TONSILLECTOMY AND ADENOIDECTOMY     Allergies  Allergen Reactions  . Codeine Other (See Comments)    Hallucinations  . Penicillins Rash    AS A BABY    No current facility-administered medications on file prior to encounter.   Current Outpatient Medications on File Prior to Encounter  Medication Sig Dispense Refill  . Cholecalciferol (VITAMIN D3) 1000 units CAPS Take 1 capsule by mouth daily.    . ferrous sulfate 325 (65 FE) MG EC tablet Take 325 mg by mouth 3 (three) times daily with meals.    . fluticasone (FLONASE) 50 MCG/ACT nasal spray Place into both nostrils daily.    Marland Kitchen levonorgestrel (MIRENA) 20 MCG/24HR IUD 1 each by Intrauterine route once.    . loratadine (CLARITIN) 10 MG tablet Take by mouth.    . Melatonin 10 MG CAPS Take by mouth. Transdermal patch not oral    . Multiple Vitamin (MULTIVITAMIN) tablet Take 1 tablet by mouth daily.    . traZODone (DESYREL) 50 MG tablet TAKE 0.5-1 TABLETS (25-50 MG TOTAL) BY MOUTH AT  BEDTIME AS NEEDED FOR SLEEP. 30 tablet 0   Social History   Socioeconomic History  . Marital status: Married    Spouse name: Not on file  . Number of children: Not on file  . Years of education: Not on file  . Highest education level: Not on file  Occupational History  . Not on file  Tobacco Use  . Smoking status: Never Smoker  . Smokeless tobacco: Never Used  Vaping Use  . Vaping Use: Never used  Substance and Sexual Activity  . Alcohol use: Not Currently    Comment: occasional  . Drug use: Never  . Sexual activity: Not on file  Other Topics Concern  . Not on file  Social History Narrative  . Not on file   Social Determinants of Health   Financial Resource Strain:   . Difficulty of Paying Living Expenses: Not on file  Food Insecurity:   . Worried About Programme researcher, broadcasting/film/video in the Last Year: Not on file  . Ran Out of Food in the Last Year: Not on file  Transportation Needs:   . Lack of Transportation (Medical): Not on file  . Lack of Transportation (Non-Medical): Not on file  Physical Activity:   . Days of Exercise per Week: Not on file  . Minutes of Exercise per Session: Not on file  Stress:   . Feeling of Stress : Not on file  Social Connections:   .  Frequency of Communication with Friends and Family: Not on file  . Frequency of Social Gatherings with Friends and Family: Not on file  . Attends Religious Services: Not on file  . Active Member of Clubs or Organizations: Not on file  . Attends Banker Meetings: Not on file  . Marital Status: Not on file  Intimate Partner Violence:   . Fear of Current or Ex-Partner: Not on file  . Emotionally Abused: Not on file  . Physically Abused: Not on file  . Sexually Abused: Not on file   Family History  Problem Relation Age of Onset  . Diabetes Mother   . Hypertension Father   . Thyroid disease Sister   . Hypertension Sister   . Thyroid disease Maternal Grandmother   . Skin cancer Maternal Grandmother     . Diabetes Maternal Grandfather   . Glaucoma Maternal Grandfather   . Diabetes Paternal Grandmother   . Skin cancer Paternal Grandfather     OBJECTIVE:  Vitals:   05/08/20 1031  BP: 134/86  Pulse: 60  Temp: 98.9 F (37.2 C)  TempSrc: Oral  SpO2: 98%    General appearance: alert; no distress Eyes: PERRLA; EOMI HENT: normocephalic; atraumatic Neck: supple with FROM Lungs: clear to auscultation bilaterally Heart: regular rate and rhythm.  Radial pulses 2+ symmetrical bilaterally Extremities: no edema; symmetrical with no gross deformities Skin: warm and dry Neurologic: CN 2-12 grossly intact; finger to nose without difficulty; normal gait; strength and sensation intact bilaterally about the upper and lower extremities; negative pronator drift Psychological: alert and cooperative; normal mood and affect   ASSESSMENT & PLAN:  1. Other migraine with status migrainosus, intractable     Meds ordered this encounter  Medications  . rizatriptan (MAXALT) 5 MG tablet    Sig: Take 1 tablet (5 mg total) by mouth as needed for migraine. May repeat in 2 hours if needed    Dispense:  10 tablet    Refill:  0    Order Specific Question:   Supervising Provider    Answer:   Merrilee Jansky [[id number]]   Declined migraine cocktail Prescribed Maxalt Rest and drink plenty of fluids Use OTC medications as needed for symptomatic relief Follow up with neurology if symptoms persists Return or go to the ER if you have any new or worsening symptoms such as fever, chills, nausea, vomiting, chest pain, shortness of breath, cough, vision changes, worsening headache despite treatment, slurred speech, facial asymmetry, weakness in arms or legs.  Reviewed expectations re: course of current medical issues. Questions answered. Outlined signs and symptoms indicating need for more acute intervention. Patient verbalized understanding. After Visit Summary given.   Moshe Cipro, NP 05/08/20  1104

## 2020-05-08 NOTE — ED Triage Notes (Signed)
Patient reports headache x 10 days, no relief with tylenol excedrin or allergy medication. No cold symptoms. Reports about 4 days ago had occular headache where everything went into kaleidoscope. No other associated symptoms.

## 2020-05-08 NOTE — Discharge Instructions (Signed)
I have sent in Maxalt for you to take as needed for migraines  If this is not helping, I would have you follow up with the [facility]  Follow up with this office or with primary care if symptoms are persisting.  Follow up in the ER for high fever, trouble swallowing, trouble breathing, other concerning symptoms.

## 2020-05-12 NOTE — Progress Notes (Signed)
New patient visit   Patient: Summer Shaffer   DOB: 23-Mar-1981   39 y.o. Female  MRN: [id number] Visit Date: 05/15/2020  Today's healthcare provider: Shirlee Latch, MD   Chief Complaint  Patient presents with  . New Patient (Initial Visit)   Subjective    Summer Shaffer is a 39 y.o. female who presents today as a new patient to establish care. Patient  HPI  Follow up Urgent Care visit  Patient was seen at Urgent Care for migraine on 05/08/2020. She was treated for migraine. Treatment for this included Maxalt 5 mg prn for migraine. May repeat in 2 hours if needed. She reports good compliance with treatment. She reports this condition is Improved.  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 Getting visual symptoms with these migraines as well.  Seeing eye doctor next week  Feels constant headache and then will amplify at times.  No nausea or vomiting, no photo/phonophobia with this type of headaches.  Seeing chiropracter is helping Frontal band of headache  Had concussion last year and was seen at [facility].  8 weeks of post-concussion limitations.  Took 3 months or so to feel better. No headaches with that, but wants to make sure this isn't related  -----------------------------------------------------------------------------------------  Does not feel nervous.  Has insomnia.  Never been diagnosed with anxiety or depression. Takes ZZquil prn. No longer on Trazodone.  This was helpful.  Allergic rhinitis - uses claritin and flonase prn  Stress urinary incontinence  Past Medical History:  Diagnosis Date  . Allergy   . Anemia   . Anxiety   . Migraines   . Pregnancy induced hypertension   . SUI (stress urinary incontinence, female)   . Vitamin D deficiency    Past Surgical History:  Procedure Laterality Date  . TONSILLECTOMY AND ADENOIDECTOMY     Family Status  Relation Name Status  . Mother  Alive  . Father  Alive  . Sister  Alive  . MGM  Alive  . MGF  Deceased  . PGM   Deceased  . PGF  Alive  . Brother  Alive  . Daughter  Alive  . Neg Hx  (Not Specified)   Family History  Problem Relation Age of Onset  . Diabetes Mother   . Hypertension Mother   . Sleep apnea Mother   . Hypertension Father   . Other Father        Antiphospholipid  . Hypertension Sister   . Thyroid disease Maternal Grandmother   . Skin cancer Maternal Grandmother   . Diabetes Maternal Grandfather   . Glaucoma Maternal Grandfather   . Diabetes Paternal Grandmother   . Skin cancer Paternal Grandfather   . Obesity Brother   . Hypertension Brother   . Insomnia Brother   . Sleep apnea Brother   . Breast cancer Neg Hx   . Colon cancer Neg Hx    Social History   Socioeconomic History  . Marital status: Married    Spouse name: Not on file  . Number of children: 1  . Years of education: Not on file  . Highest education level: Not on file  Occupational History  . Not on file  Tobacco Use  . Smoking status: Never Smoker  . Smokeless tobacco: Never Used  Vaping Use  . Vaping Use: Never used  Substance and Sexual Activity  . Alcohol use: Not Currently    Comment: occasional, nothing every month  . Drug use: Never  . Sexual activity: Yes  Partners: Male    Birth control/protection: I.U.D.  Other Topics Concern  . Not on file  Social History Narrative  . Not on file   Social Determinants of Health   Financial Resource Strain:   . Difficulty of Paying Living Expenses: Not on file  Food Insecurity:   . Worried About Programme researcher, broadcasting/film/video in the Last Year: Not on file  . Ran Out of Food in the Last Year: Not on file  Transportation Needs:   . Lack of Transportation (Medical): Not on file  . Lack of Transportation (Non-Medical): Not on file  Physical Activity:   . Days of Exercise per Week: Not on file  . Minutes of Exercise per Session: Not on file  Stress:   . Feeling of Stress : Not on file  Social Connections:   . Frequency of Communication with Friends and  Family: Not on file  . Frequency of Social Gatherings with Friends and Family: Not on file  . Attends Religious Services: Not on file  . Active Member of Clubs or Organizations: Not on file  . Attends Banker Meetings: Not on file  . Marital Status: Not on file   Outpatient Medications Prior to Visit  Medication Sig  . Cholecalciferol (VITAMIN D3) 1000 units CAPS Take 1 capsule by mouth daily.  . ferrous sulfate 325 (65 FE) MG EC tablet Take 325 mg by mouth 3 (three) times daily with meals.  . fluticasone (FLONASE) 50 MCG/ACT nasal spray Place into both nostrils daily.  Marland Kitchen levonorgestrel (MIRENA) 20 MCG/24HR IUD 1 each by Intrauterine route once.  . loratadine (CLARITIN) 10 MG tablet Take by mouth.  . Multiple Vitamin (MULTIVITAMIN) tablet Take 1 tablet by mouth daily.  . rizatriptan (MAXALT) 5 MG tablet Take 1 tablet (5 mg total) by mouth as needed for migraine. May repeat in 2 hours if needed  . [DISCONTINUED] Melatonin 10 MG CAPS Take by mouth. Transdermal patch not oral  . [DISCONTINUED] traZODone (DESYREL) 50 MG tablet TAKE 0.5-1 TABLETS (25-50 MG TOTAL) BY MOUTH AT BEDTIME AS NEEDED FOR SLEEP.   No facility-administered medications prior to visit.   Allergies  Allergen Reactions  . Codeine Other (See Comments)    Hallucinations  . Penicillins Rash    AS A BABY     Immunization History  Administered Date(s) Administered  . Influenza,inj,Quad PF,6+ Mos 05/15/2020  . PFIZER SARS-COV-2 Vaccination 09/24/2019, 10/19/2019  . Tdap 12/30/2014    Health Maintenance  Topic Date Due  . Hepatitis C Screening  Never done  . HIV Screening  Never done  . PAP SMEAR-Modifier  11/29/2021  . TETANUS/TDAP  12/29/2024  . INFLUENZA VACCINE  Completed  . COVID-19 Vaccine  Completed    Patient Care Team: Erasmo Downer, MD as PCP - General (Family Medicine)  Review of Systems  Constitutional: Positive for fatigue.  HENT: Positive for mouth sores.   Eyes: Positive  for visual disturbance.  Respiratory: Negative.   Cardiovascular: Negative.   Gastrointestinal: Negative.   Endocrine: Positive for polyuria.  Genitourinary: Positive for enuresis and urgency.  Musculoskeletal: Positive for back pain, neck pain and neck stiffness.  Skin: Negative.   Allergic/Immunologic: Positive for environmental allergies.  Neurological: Positive for headaches.  Hematological: Negative.   Psychiatric/Behavioral: Positive for decreased concentration and sleep disturbance. The patient is nervous/anxious.     Last CBC Lab Results  Component Value Date   WBC 7.1 05/11/2019   HGB 12.9 05/11/2019   HCT 38.5 05/11/2019  MCV 88.3 05/11/2019   MCH 29.2 10/06/2018   RDW 12.5 05/11/2019   PLT 299.0 05/11/2019   Last metabolic panel Lab Results  Component Value Date   GLUCOSE 100 (H) 05/11/2019   NA 136 05/11/2019   K 3.5 05/11/2019   CL 104 05/11/2019   CO2 26 05/11/2019   BUN 6 05/11/2019   CREATININE 0.65 05/11/2019   CALCIUM 8.7 05/11/2019   PROT 7.8 05/11/2019   ALBUMIN 4.3 05/11/2019   BILITOT 0.7 05/11/2019   ALKPHOS 59 05/11/2019   AST 14 05/11/2019   ALT 12 05/11/2019   Last lipids Lab Results  Component Value Date   CHOL 138 05/11/2019   HDL 44.50 05/11/2019   LDLCALC 83 05/11/2019   TRIG 52.0 05/11/2019   CHOLHDL 3 05/11/2019   Last hemoglobin A1c No results found for: HGBA1C Last thyroid functions Lab Results  Component Value Date   TSH 1.94 05/11/2019   Last vitamin D Lab Results  Component Value Date   VD25OH 24.81 (L) 05/11/2019   Last vitamin B12 and Folate No results found for: VITAMINB12, FOLATE    Objective    BP 118/75 (BP Location: Left Arm, Patient Position: Sitting, Cuff Size: Normal)   Pulse 65   Temp 98 F (36.7 C) (Oral)   Resp 16   Ht 5\' 7"  (1.702 m)   Wt 180 lb (81.6 kg)   BMI 28.19 kg/m  Physical Exam Vitals reviewed.  Constitutional:      General: She is not in acute distress.    Appearance: Normal  appearance. She is well-developed. She is not diaphoretic.  HENT:     Head: Normocephalic and atraumatic.     Right Ear: Tympanic membrane, ear canal and external ear normal.     Left Ear: Tympanic membrane, ear canal and external ear normal.  Eyes:     General: No scleral icterus.    Conjunctiva/sclera: Conjunctivae normal.     Pupils: Pupils are equal, round, and reactive to light.  Neck:     Thyroid: No thyromegaly.  Cardiovascular:     Rate and Rhythm: Normal rate and regular rhythm.     Pulses: Normal pulses.     Heart sounds: Normal heart sounds. No murmur heard.   Pulmonary:     Effort: Pulmonary effort is normal. No respiratory distress.     Breath sounds: Normal breath sounds. No wheezing or rales.  Abdominal:     General: There is no distension.     Palpations: Abdomen is soft.     Tenderness: There is no abdominal tenderness.  Musculoskeletal:        General: No deformity.     Cervical back: Neck supple.     Right lower leg: No edema.     Left lower leg: No edema.     Comments: Tightness of bilateral trapezius muscles.  Lymphadenopathy:     Cervical: No cervical adenopathy.  Skin:    General: Skin is warm and dry.     Findings: No rash.  Neurological:     Mental Status: She is alert and oriented to person, place, and time. Mental status is at baseline.     Sensory: No sensory deficit.     Motor: No weakness.     Gait: Gait normal.  Psychiatric:        Mood and Affect: Mood normal.        Behavior: Behavior normal.        Thought Content: Thought content normal.  Depression Screen PHQ 2/9 Scores 05/15/2020 05/11/2019 05/11/2019 11/17/2018  PHQ - 2 Score 1 0 0 2  PHQ- 9 Score 7 8 - 12   No results found for any visits on 05/15/20.  Assessment & Plan      Problem List Items Addressed This Visit      Cardiovascular and Mediastinum   Ocular migraine    Longstanding and intermittent issue Predominant headaches are not migranous in quality Ok to  use maxalt prn for migraines Discussed use limits and possible side effects      Relevant Medications   traZODone (DESYREL) 50 MG tablet   cyclobenzaprine (FLEXERIL) 10 MG tablet     Respiratory   Seasonal allergic rhinitis due to pollen    Continue Claritin and flonase prn        Nervous and Auditory   Chronic tension-type headache, intractable - Primary    Predominant daily headache is tension-type Also with some overlap of analgesic rebound headache - see below Discussed getting adequate sleep/hydration Consider massage Trial of home/self OMM technique at base of skull Muscle relaxer as needed Return precautions discussed      Relevant Medications   traZODone (DESYREL) 50 MG tablet   cyclobenzaprine (FLEXERIL) 10 MG tablet     Other   Psychophysiological insomnia    Longstanding Some situational anxiety No generalized anxiety that requires medication Can use trazodone as needed Discussed avoiding using this with other sedating medications, like Flexeril      Relevant Medications   traZODone (DESYREL) 50 MG tablet   Analgesic rebound headache    Concomitant with tension headache as above Stop OTC analgesics Offered steroid taper, but patient wishes to try without this at this time      Relevant Medications   traZODone (DESYREL) 50 MG tablet   cyclobenzaprine (FLEXERIL) 10 MG tablet   SUI (stress urinary incontinence, female)    Ongoing since vaginal delivery of her child Patient is amenable to trying pelvic floor PT, so referral was placed today      Relevant Orders   Ambulatory referral to Physical Therapy    Other Visit Diagnoses    Need for influenza vaccination       Relevant Orders   Flu Vaccine QUAD 36+ mos IM (Completed)       Return in about 3 months (around 08/15/2020) for CPE.     Total time spent on today's visit was greater than 45 minutes, including both face-to-face time and nonface-to-face time personally spent on review of chart  (labs and imaging), discussing labs and goals, discussing further work-up, treatment options, referrals to specialist if needed, reviewing outside records of pertinent, answering patient's questions, and coordinating care.   I, Shirlee LatchAngela Matther Labell, MD, have reviewed all documentation for this visit. The documentation on 05/15/20 for the exam, diagnosis, procedures, and orders are all accurate and complete.   Lynae Pederson, Marzella SchleinAngela M, MD, MPH [facility]

## 2020-05-15 ENCOUNTER — Ambulatory Visit: Payer: 59 | Admitting: Family Medicine

## 2020-05-15 ENCOUNTER — Encounter: Payer: Self-pay | Admitting: Family Medicine

## 2020-05-15 ENCOUNTER — Other Ambulatory Visit: Payer: Self-pay

## 2020-05-15 VITALS — BP 118/75 | HR 65 | Temp 98.0°F | Resp 16 | Ht 67.0 in | Wt 180.0 lb

## 2020-05-15 DIAGNOSIS — F5104 Psychophysiologic insomnia: Secondary | ICD-10-CM

## 2020-05-15 DIAGNOSIS — N393 Stress incontinence (female) (male): Secondary | ICD-10-CM | POA: Insufficient documentation

## 2020-05-15 DIAGNOSIS — G44221 Chronic tension-type headache, intractable: Secondary | ICD-10-CM | POA: Diagnosis not present

## 2020-05-15 DIAGNOSIS — T3995XA Adverse effect of unspecified nonopioid analgesic, antipyretic and antirheumatic, initial encounter: Secondary | ICD-10-CM

## 2020-05-15 DIAGNOSIS — G43109 Migraine with aura, not intractable, without status migrainosus: Secondary | ICD-10-CM | POA: Insufficient documentation

## 2020-05-15 DIAGNOSIS — J301 Allergic rhinitis due to pollen: Secondary | ICD-10-CM | POA: Insufficient documentation

## 2020-05-15 DIAGNOSIS — Z23 Encounter for immunization: Secondary | ICD-10-CM

## 2020-05-15 DIAGNOSIS — G4440 Drug-induced headache, not elsewhere classified, not intractable: Secondary | ICD-10-CM | POA: Insufficient documentation

## 2020-05-15 MED ORDER — TRAZODONE HCL 50 MG PO TABS: 25.0000 mg | ORAL_TABLET | Freq: Every evening | ORAL | 5 refills | Status: DC | PRN

## 2020-05-15 MED ORDER — CYCLOBENZAPRINE HCL 10 MG PO TABS: 10.0000 mg | ORAL_TABLET | Freq: Three times a day (TID) | ORAL | 1 refills | Status: DC | PRN

## 2020-05-15 NOTE — Assessment & Plan Note (Signed)
Predominant daily headache is tension-type Also with some overlap of analgesic rebound headache - see below Discussed getting adequate sleep/hydration Consider massage Trial of home/self OMM technique at base of skull Muscle relaxer as needed Return precautions discussed

## 2020-05-15 NOTE — Patient Instructions (Signed)
Analgesic Rebound Headache An analgesic rebound headache, sometimes called a medication overuse headache, is a headache that comes after pain medicine (analgesic) taken to treat the original (primary) headache has worn off. Any type of primary headache can return as a rebound headache if a person regularly takes analgesics more than three times a week to treat it. The types of primary headaches that are commonly associated with rebound headaches include:  Migraines.  Headaches that arise from tense muscles in the head and neck area (tension headaches).  Headaches that develop and happen again (recur) on one side of the head and around the eye (cluster headaches). If rebound headaches continue, they become chronic daily headaches. What are the causes? This condition may be caused by frequent use of:  Over-the-counter medicines such as aspirin, ibuprofen, and acetaminophen.  Sinus relief medicines and other medicines that contain caffeine.  Narcotic pain medicines such as codeine and oxycodone. What are the signs or symptoms? The symptoms of a rebound headache are the same as the symptoms of the original headache. Some of the symptoms of specific types of headaches include: Migraine headache  Pulsing or throbbing pain on one or both sides of the head.  Severe pain that interferes with daily activities.  Pain that is worsened by physical activity.  Nausea, vomiting, or both.  Pain with exposure to bright light, loud noises, or strong smells.  General sensitivity to bright light, loud noises, or strong smells.  Visual changes.  Numbness of one or both arms. Tension headache  Pressure around the head.  Dull, aching head pain.  Pain felt over the front and sides of the head.  Tenderness in the muscles of the head, neck, and shoulders. Cluster headache  Severe pain that begins in or around one eye or temple.  Redness and tearing in the eye on the same side as the  pain.  Droopy or swollen eyelid.  One-sided head pain.  Nausea.  Runny nose.  Sweaty, pale facial skin.  Restlessness. How is this diagnosed? This condition is diagnosed by:  Reviewing your medical history. This includes the nature of your primary headaches.  Reviewing the types of pain medicines that you have been using to treat your headaches and how often you take them. How is this treated? This condition may be treated or managed by:  Discontinuing frequent use of the analgesic medicine. Doing this may worsen your headaches at first, but the pain should eventually become more manageable, less frequent, and less severe.  Seeing a headache specialist. He or she may be able to help you manage your headaches and help make sure there is not another cause of the headaches.  Using methods of stress relief, such as acupuncture, counseling, biofeedback, and massage. Talk with your health care provider about which methods might be good for you. Follow these instructions at home:  Take over-the-counter and prescription medicines only as told by your health care provider.  Stop the repeated use of pain medicine as told by your health care provider. Stopping can be difficult. Carefully follow instructions from your health care provider.  Avoid triggers that are known to cause your primary headaches.  Keep all follow-up visits as told by your health care provider. This is important. Contact a health care provider if:  You continue to experience headaches after following treatments that your health care provider recommended. Get help right away if:  You develop new headache pain.  You develop headache pain that is different than what you have   experienced in the past.  You develop numbness or tingling in your arms or legs.  You develop changes in your speech or vision. This information is not intended to replace advice given to you by your health care provider. Make sure you  discuss any questions you have with your health care provider. Document Revised: 05/30/2017 Document Reviewed: 11/20/2015 Elsevier Patient Education  2020 [employer]  

## 2020-05-15 NOTE — Assessment & Plan Note (Signed)
Continue Claritin and flonase prn

## 2020-05-15 NOTE — Assessment & Plan Note (Signed)
Longstanding and intermittent issue Predominant headaches are not migranous in quality Ok to use maxalt prn for migraines Discussed use limits and possible side effects

## 2020-05-15 NOTE — Assessment & Plan Note (Signed)
Ongoing since vaginal delivery of her child Patient is amenable to trying pelvic floor PT, so referral was placed today

## 2020-05-15 NOTE — Assessment & Plan Note (Signed)
Longstanding Some situational anxiety No generalized anxiety that requires medication Can use trazodone as needed Discussed avoiding using this with other sedating medications, like Flexeril

## 2020-05-15 NOTE — Assessment & Plan Note (Signed)
Concomitant with tension headache as above Stop OTC analgesics Offered steroid taper, but patient wishes to try without this at this time

## 2020-06-06 ENCOUNTER — Other Ambulatory Visit: Payer: Self-pay | Admitting: Family Medicine

## 2020-06-06 DIAGNOSIS — F5104 Psychophysiologic insomnia: Secondary | ICD-10-CM

## 2020-07-10 ENCOUNTER — Ambulatory Visit: Payer: 59 | Admitting: Family Medicine

## 2020-07-15 IMAGING — US TRANSVAGINAL OB ULTRASOUND
1 series · 15 of 28 positions shown · non-contrast
Comparison: Prior ultrasound from 09/29/2018.

CLINICAL DATA: Initial evaluation for vaginal bleeding, early
pregnancy.

EXAM:
OBSTETRIC <14 WK US AND TRANSVAGINAL OB US
TECHNIQUE: Both transabdominal and transvaginal ultrasound examinations were
performed for complete evaluation of the gestation as well as the
maternal uterus, adnexal regions, and pelvic cul-de-sac.
Transvaginal technique was performed to assess early pregnancy.

[Series 1: transvaginal ob ultrasound · 15 of 29 slices shown]
[im 1/29]
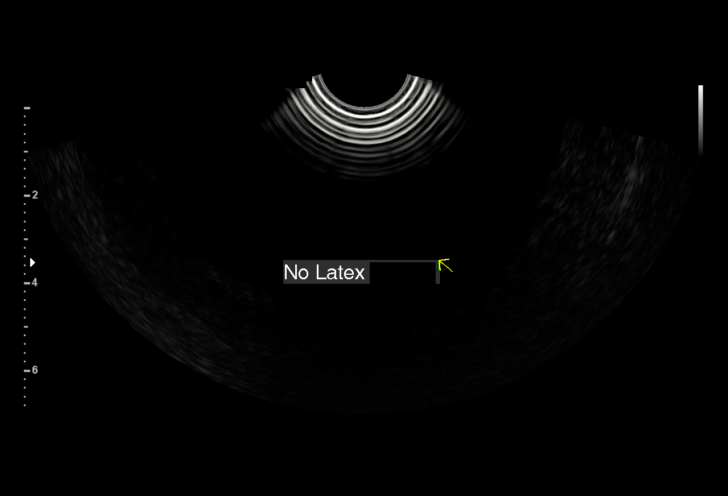
[im 3/29]
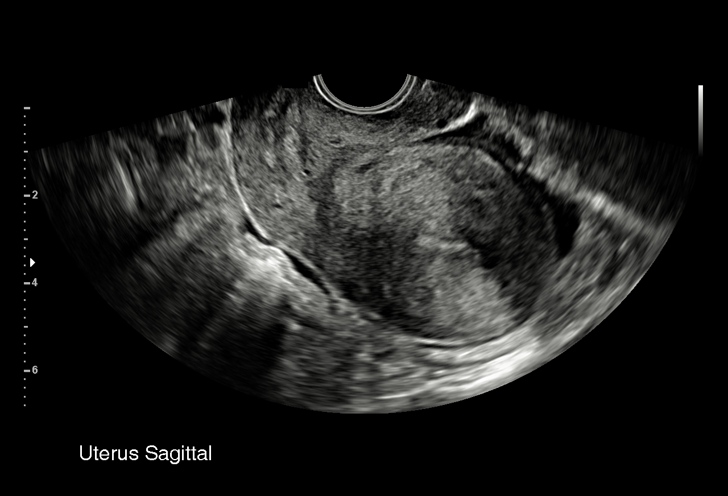
[im 5/29]
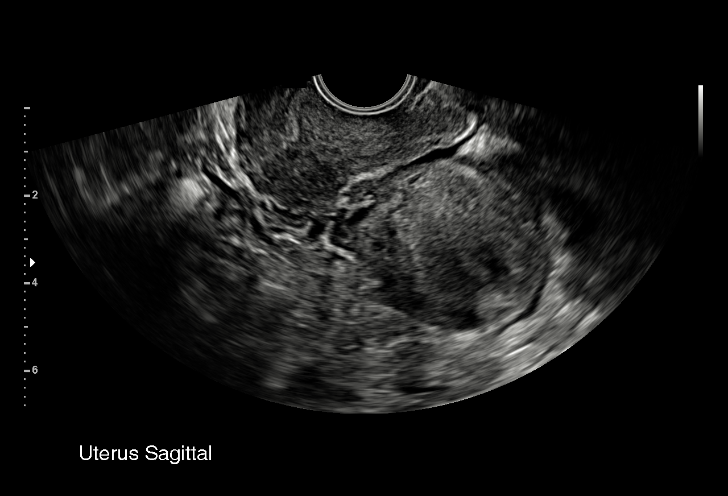
[im 7/29]
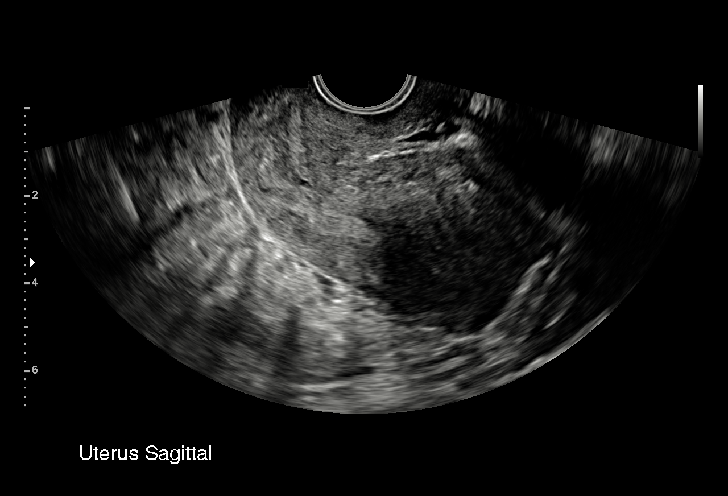
[im 9/29]
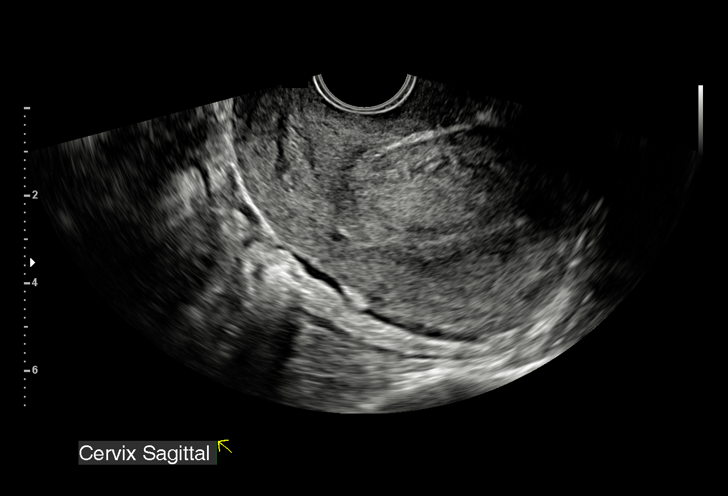
[im 11/29]
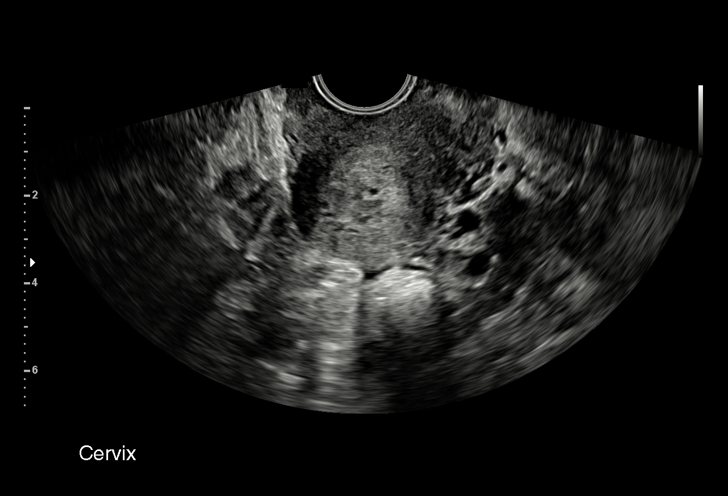
[im 13/29]
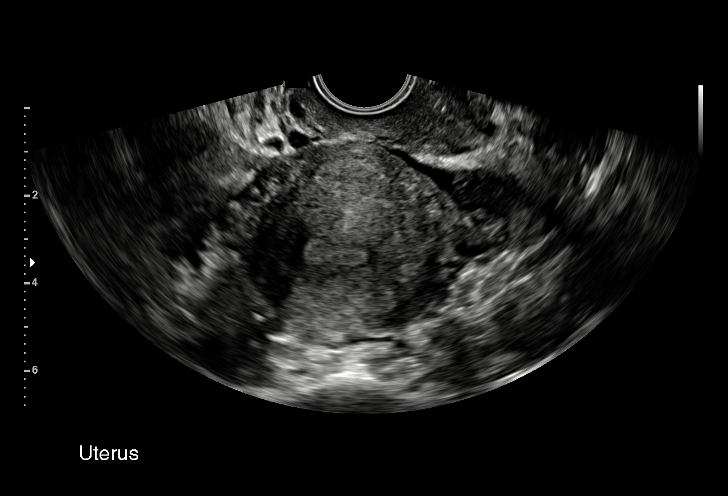
[im 15/29]
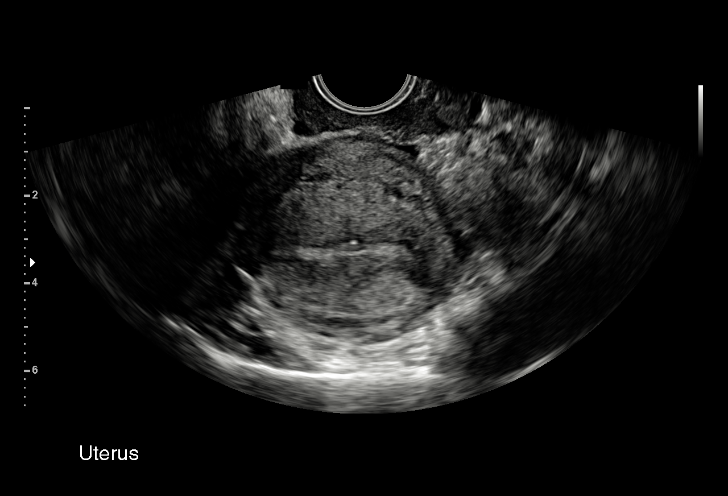
[im 16/29]
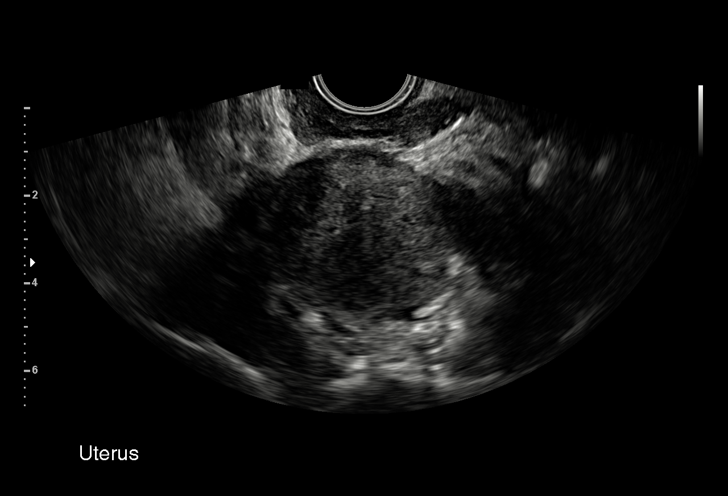
[im 18/29]
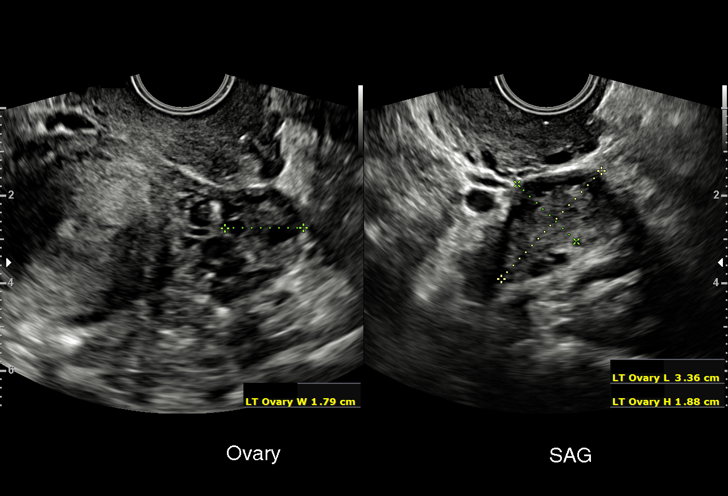
[im 20/29]
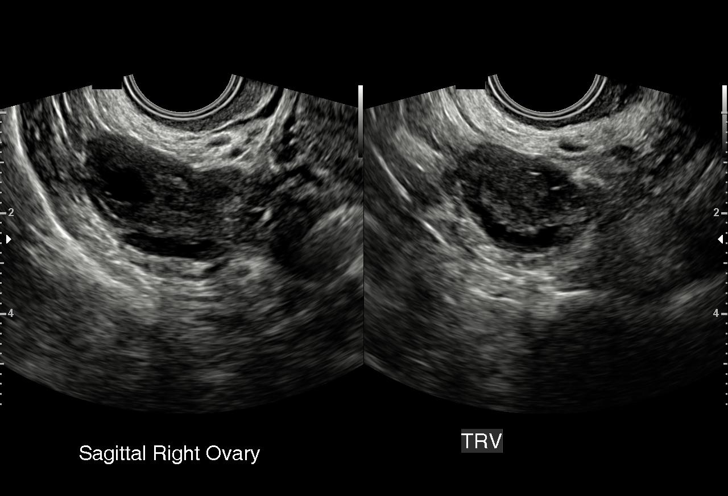
[im 22/29]
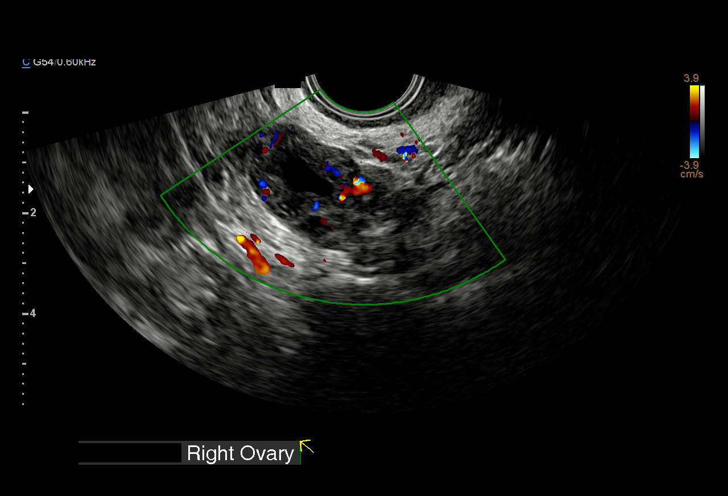
[im 24/29]
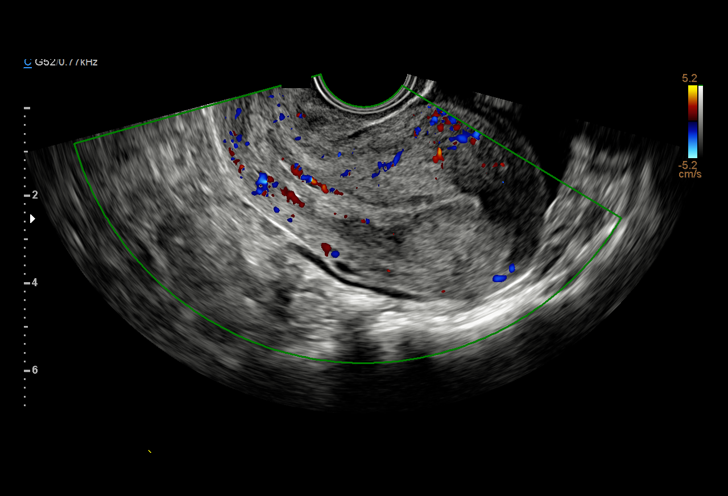
[im 26/29]
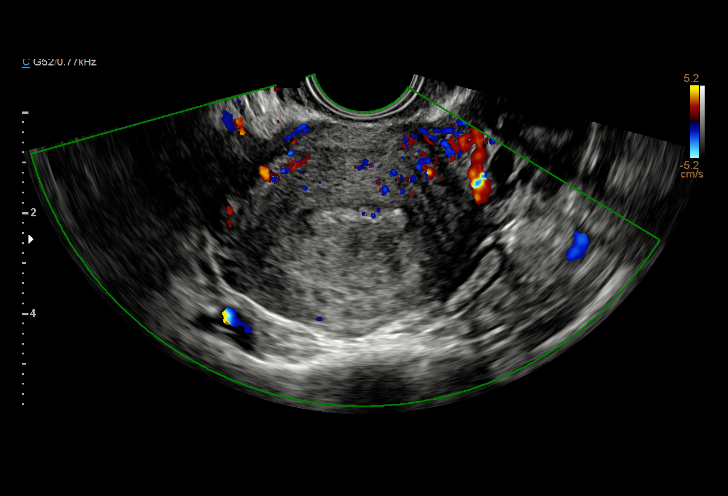
[im 29/29]
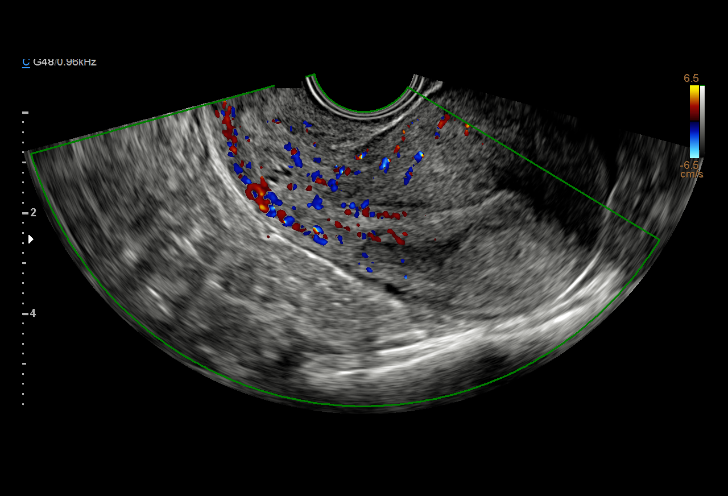

[15 of 28 positions shown; findings below may reference images not displayed]

FINDINGS: Intrauterine gestational sac: Previously seen small intrauterine
gestational sac is no longer visualized. Associated vascularity seen
within the endometrial stripe.

Yolk sac:  Negative.

Embryo:  Negative.

Cardiac Activity: N/A

Heart Rate: Insert in a bpm

Subchorionic hemorrhage:  None visualized.

Maternal uterus/adnexae: Ovaries are normal in appearance
bilaterally. Small corpus luteal cyst noted on the right.

No free fluid within the pelvis.
IMPRESSION: 1. Nonvisualization of previously seen intrauterine gestational sac,
compatible with interval abortion/miscarriage. Persistent
vascularity within the endometrium could reflect retained products
of conception. Clinical correlation recommended.
2. No other acute maternal uterine or adnexal abnormality
identified.

## 2020-09-26 ENCOUNTER — Ambulatory Visit (INDEPENDENT_AMBULATORY_CARE_PROVIDER_SITE_OTHER): Payer: 59 | Admitting: Family Medicine

## 2020-09-26 DIAGNOSIS — Z5329 Procedure and treatment not carried out because of patient's decision for other reasons: Secondary | ICD-10-CM

## 2020-09-26 NOTE — Patient Instructions (Signed)
Preventive Care 84-40 Years Old, Female Preventive care refers to lifestyle choices and visits with your health care provider that can promote health and wellness. This includes:  A yearly physical exam. This is also called an annual wellness visit.  Regular dental and eye exams.  Immunizations.  Screening for certain conditions.  Healthy lifestyle choices, such as: ? Eating a healthy diet. ? Getting regular exercise. ? Not using drugs or products that contain nicotine and tobacco. ? Limiting alcohol use. What can I expect for my preventive care visit? Physical exam Your health care provider will check your:  Height and weight. These may be used to calculate your BMI (body mass index). BMI is a measurement that tells if you are at a healthy weight.  Heart rate and blood pressure.  Body temperature.  Skin for abnormal spots. Counseling Your health care provider may ask you questions about your:  Past medical problems.  Family's medical history.  Alcohol, tobacco, and drug use.  Emotional well-being.  Home life and relationship well-being.  Sexual activity.  Diet, exercise, and sleep habits.  Work and work Statistician.  Access to firearms.  Method of birth control.  Menstrual cycle.  Pregnancy history. What immunizations do I need? Vaccines are usually given at various ages, according to a schedule. Your health care provider will recommend vaccines for you based on your age, medical history, and lifestyle or other factors, such as travel or where you work.   What tests do I need? Blood tests  Lipid and cholesterol levels. These may be checked every 5 years, or more often if you are over 40 years old.  Hepatitis C test.  Hepatitis B test. Screening  Lung cancer screening. You may have this screening every year starting at age 40 if you have a 30-pack-year history of smoking and currently smoke or have quit within the past 15 years.  Colorectal cancer  screening. ? All adults should have this screening starting at age 40 and continuing until age 17. ? Your health care provider may recommend screening at age 40 if you are at increased risk. ? You will have tests every 1-10 years, depending on your results and the type of screening test.  Diabetes screening. ? This is done by checking your blood sugar (glucose) after you have not eaten for a while (fasting). ? You may have this done every 1-3 years.  Mammogram. ? This may be done every 1-2 years. ? Talk with your health care provider about when you should start having regular mammograms. This may depend on whether you have a family history of breast cancer.  BRCA-related cancer screening. This may be done if you have a family history of breast, ovarian, tubal, or peritoneal cancers.  Pelvic exam and Pap test. ? This may be done every 3 years starting at age 10. ? Starting at age 11, this may be done every 5 years if you have a Pap test in combination with an HPV test. Other tests  STD (sexually transmitted disease) testing, if you are at risk.  Bone density scan. This is done to screen for osteoporosis. You may have this scan if you are at high risk for osteoporosis. Talk with your health care provider about your test results, treatment options, and if necessary, the need for more tests. Follow these instructions at home: Eating and drinking  Eat a diet that includes fresh fruits and vegetables, whole grains, lean protein, and low-fat dairy products.  Take vitamin and mineral supplements  as recommended by your health care provider.  Do not drink alcohol if: ? Your health care provider tells you not to drink. ? You are pregnant, may be pregnant, or are planning to become pregnant.  If you drink alcohol: ? Limit how much you have to 0-1 drink a day. ? Be aware of how much alcohol is in your drink. In the [place] one drink equals one 12 oz bottle of beer (355 mL), one 5 oz glass of  wine (148 mL), or one 1 oz glass of hard liquor (44 mL).   Lifestyle  Take daily care of your teeth and gums. Brush your teeth every morning and night with fluoride toothpaste. Floss one time each day.  Stay active. Exercise for at least 30 minutes 5 or more days each week.  Do not use any products that contain nicotine or tobacco, such as cigarettes, e-cigarettes, and chewing tobacco. If you need help quitting, ask your health care provider.  Do not use drugs.  If you are sexually active, practice safe sex. Use a condom or other form of protection to prevent STIs (sexually transmitted infections).  If you do not wish to become pregnant, use a form of birth control. If you plan to become pregnant, see your health care provider for a prepregnancy visit.  If told by your health care provider, take low-dose aspirin daily starting at age 50.  Find healthy ways to cope with stress, such as: ? Meditation, yoga, or listening to music. ? Journaling. ? Talking to a trusted person. ? Spending time with friends and family. Safety  Always wear your seat belt while driving or riding in a vehicle.  Do not drive: ? If you have been drinking alcohol. Do not ride with someone who has been drinking. ? When you are tired or distracted. ? While texting.  Wear a helmet and other protective equipment during sports activities.  If you have firearms in your house, make sure you follow all gun safety procedures. What's next?  Visit your health care provider once a year for an annual wellness visit.  Ask your health care provider how often you should have your eyes and teeth checked.  Stay up to date on all vaccines. This information is not intended to replace advice given to you by your health care provider. Make sure you discuss any questions you have with your health care provider. Document Revised: 03/21/2020 Document Reviewed: 02/26/2018 Elsevier Patient Education  2021 [employer]vier Inc.  

## 2020-09-26 NOTE — Progress Notes (Signed)
No show- disregard

## 2020-11-03 ENCOUNTER — Telehealth: Payer: 59 | Admitting: Family

## 2020-11-03 DIAGNOSIS — B373 Candidiasis of vulva and vagina: Secondary | ICD-10-CM

## 2020-11-03 DIAGNOSIS — B3731 Acute candidiasis of vulva and vagina: Secondary | ICD-10-CM

## 2020-11-03 MED ORDER — FLUCONAZOLE 150 MG PO TABS: 150.0000 mg | ORAL_TABLET | ORAL | 0 refills | Status: DC | PRN

## 2020-11-03 NOTE — Progress Notes (Signed)

## 2021-02-22 ENCOUNTER — Emergency Department: Payer: 59

## 2021-02-22 ENCOUNTER — Other Ambulatory Visit: Payer: Self-pay

## 2021-02-22 ENCOUNTER — Emergency Department
Admission: EM | Admit: 2021-02-22 | Discharge: 2021-02-22 | Disposition: A | Discharge status: Home / Self Care | Payer: 59 | Attending: Emergency Medicine | Admitting: Emergency Medicine

## 2021-02-22 DIAGNOSIS — R0789 Other chest pain: Secondary | ICD-10-CM | POA: Insufficient documentation

## 2021-02-22 LAB — BASIC METABOLIC PANEL
Anion gap: 6 (ref 5–15)
BUN: 9 mg/dL (ref 6–20)
CO2: 25 mmol/L (ref 22–32)
Calcium: 8.8 mg/dL — ABNORMAL LOW (ref 8.9–10.3)
Chloride: 104 mmol/L (ref 98–111)
Creatinine, Ser: 0.65 mg/dL (ref 0.44–1.00)
GFR, Estimated: >60 mL/min (ref >60)
Glucose, Bld: 103 mg/dL — ABNORMAL HIGH (ref 70–99)
Potassium: 3.8 mmol/L (ref 3.5–5.1)
Sodium: 135 mmol/L (ref 135–145)

## 2021-02-22 LAB — CBC
HCT: 40.5 % (ref 36.0–46.0)
Hemoglobin: 14.1 g/dL (ref 12.0–15.0)
MCH: 31.3 pg (ref 26.0–34.0)
MCHC: 34.8 g/dL (ref 30.0–36.0)
MCV: 90 fL (ref 80.0–100.0)
Platelets: 290 10*3/uL (ref 150–400)
RBC: 4.5 MIL/uL (ref 3.87–5.11)
RDW: 11.6 % (ref 11.5–15.5)
WBC: 7.2 10*3/uL (ref 4.0–10.5)
nRBC: 0 % (ref 0.0–0.2)

## 2021-02-22 LAB — D-DIMER, QUANTITATIVE: D-Dimer, Quant: 0.34 ug/mL-FEU (ref 0.00–0.50)

## 2021-02-22 LAB — TROPONIN I (HIGH SENSITIVITY)
Troponin I (High Sensitivity): <2 ng/L (ref <18)
Troponin I (High Sensitivity): 3 ng/L (ref <18)

## 2021-02-22 NOTE — ED Provider Notes (Signed)
[facility] Emergency Department Provider Note ____________________________________________   Event Date/Time   First MD Initiated Contact with Patient 02/22/21 [phone]     (approximate)  I have reviewed the triage vital signs and the nursing notes.   HISTORY  Chief Complaint Chest Pain    HPI Summer Shaffer is a 40 y.o. female with PMH as noted below who presents with chest discomfort, acute onset when she awoke today around 6 AM, described as a heaviness and pressure in the middle of her chest with some pain radiating to the right side of the chest, right shoulder and right arm.  She also reports tingling in her fingers on both sides.  She has no leg pain or swelling.  She denies any shortness of breath but reports that the discomfort is worse with deep inspiration.  The patient is concerned about a family history of high blood pressure so she checked her blood pressure and found it to be 170/112.  The patient states that the chest comfort has subsided but she still feels "off."  She denies any prior history of similar episodes.  She states that she felt fine yesterday.   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 Authorizing Provider  Cholecalciferol (VITAMIN D3) 1000 units CAPS Take 1 capsule by mouth daily.     [provider]  cyclobenzaprine (FLEXERIL) 10 MG tablet Take 1 tablet (10 mg total) by mouth 3 (three) times daily as needed for muscle spasms. 05/15/20   Erasmo Downer, MD  ferrous sulfate 325 (65 FE) MG EC tablet Take 325 mg by mouth 3 (three) times daily with meals.    [provider]  fluconazole (DIFLUCAN) 150 MG tablet Take 1 tablet (150 mg total) by mouth every three (3) days as needed. 11/03/20   Junie Spencer, FNP  fluticasone (FLONASE) 50 MCG/ACT nasal spray Place into both nostrils daily.    [provider]  levonorgestrel (MIRENA) 20 MCG/24HR IUD 1 each by Intrauterine route once.    [provider]  loratadine (CLARITIN) 10 MG tablet Take by mouth.    [provider]  Multiple Vitamin (MULTIVITAMIN) tablet Take 1 tablet by mouth daily.    [provider]  rizatriptan (MAXALT) 5 MG tablet Take 1 tablet (5 mg total) by mouth as needed for migraine. May repeat in 2 hours if needed 05/08/20   Moshe Cipro, NP  traZODone (DESYREL) 50 MG tablet TAKE 0.5-1 TABLETS BY MOUTH AT BEDTIME AS NEEDED FOR SLEEP. 06/06/20   Erasmo Downer, MD    Allergies Codeine and Penicillins  Family History  Problem Relation Age of Onset   Diabetes Mother    Hypertension Mother    Sleep apnea Mother    Hypertension Father    Other Father  Antiphospholipid   Hypertension Sister    Thyroid disease Maternal Grandmother    Skin cancer Maternal Grandmother    Diabetes Maternal Grandfather    Glaucoma Maternal Grandfather    Diabetes Paternal Grandmother    Skin cancer Paternal Grandfather    Obesity Brother    Hypertension Brother    Insomnia Brother    Sleep apnea Brother    Breast cancer Neg Hx    Colon cancer Neg Hx     Social History Social History   Tobacco Use   Smoking status: Never   Smokeless tobacco: Never  Vaping Use   Vaping Use: Never used  Substance Use Topics   Alcohol use: Not Currently     Comment: occasional, nothing every month   Drug use: Never    Review of Systems  Constitutional: No fever/chills Eyes: No visual changes. ENT: No sore throat. Cardiovascular: Positive for chest discomfort. Respiratory: Denies shortness of breath. Gastrointestinal: No vomiting or diarrhea.  Genitourinary: Negative for flank pain. Musculoskeletal: Negative for back pain. Skin: Negative for rash. Neurological: Negative for headaches, focal weakness or numbness.   ____________________________________________   PHYSICAL EXAM:  VITAL SIGNS: ED Triage Vitals  Enc Vitals Group     BP 02/22/21 0910 (!) 130/96     Pulse Rate 02/22/21 0910 62     Resp 02/22/21 0910 16     Temp 02/22/21 0910 98.5 F (36.9 C)     Temp Source 02/22/21 0910 Oral     SpO2 02/22/21 0910 99 %     Weight 02/22/21 0905 160 lb (72.6 kg)     Height 02/22/21 0905 5\' 4"  (1.626 m)     Head Circumference --      Peak Flow --      Pain Score 02/22/21 0905 5     Pain Loc --      Pain Edu? --      Excl. in GC? --     Constitutional: Alert and oriented. Well appearing and in no acute distress. Eyes: Conjunctivae are normal.  Head: Atraumatic. Nose: No congestion/rhinnorhea. Mouth/Throat: Mucous membranes are moist.   Neck: Normal range of motion.  Cardiovascular: Normal rate, regular rhythm. Grossly normal heart sounds.  Good peripheral circulation. Respiratory: Normal respiratory effort.  No retractions. Lungs CTAB. Gastrointestinal: No distention.  Musculoskeletal: No lower extremity edema.  No calf or popliteal swelling or tenderness.  Extremities warm and well perfused.  Neurologic:  Normal speech and language. No gross focal neurologic deficits are appreciated.  Skin:  Skin is warm and dry. No rash noted. Psychiatric: Mood and affect are normal. Speech and behavior are normal.  ____________________________________________   LABS (all labs ordered are listed, but only abnormal results are  displayed)  Labs Reviewed  BASIC METABOLIC PANEL - Abnormal; Notable for the following components:      Result Value   Glucose, Bld 103 (*)    Calcium 8.8 (*)    All other components within normal limits  CBC  D-DIMER, QUANTITATIVE  POC URINE PREG, ED  TROPONIN I (HIGH SENSITIVITY)  TROPONIN I (HIGH SENSITIVITY)   ____________________________________________  EKG  ED ECG REPORT I, 02/24/21, the attending physician, personally viewed and interpreted this ECG.  Date: 02/22/2021 EKG Time: 0903 Rate: 65 Rhythm: normal sinus rhythm QRS Axis: normal Intervals: normal ST/T Wave abnormalities: normal Narrative Interpretation: no evidence of acute ischemia  ____________________________________________  RADIOLOGY  Chest x-ray interpreted by me shows no focal consolidation or edema  ____________________________________________  PROCEDURES  Procedure(s) performed: No  Procedures  Critical Care performed: No ____________________________________________   INITIAL IMPRESSION / ASSESSMENT AND PLAN / ED COURSE  Pertinent labs & imaging results that were available during my care of the patient were reviewed by me and considered in my medical decision making (see chart for details).   40 year old female with PMH as noted above presents with atypical chest discomfort since she awoke around 6 AM associated with tingling in her fingers.  The symptoms have improved since then.  She was hypertensive at home but this has also resolved.  On exam the patient is overall well-appearing.  Her vital signs are normal.  The physical exam is unremarkable.  EKG is nonischemic.  Differential includes musculoskeletal pain, GERD, hyperventilation, or less likely cardiac etiology.  I have a low suspicion for ACS given the patient's age and lack of risk factors.  I also have a low suspicion for PE given the lack of tachycardia or hypoxia and lack of DVT symptoms.  The patient is not on  OCPs.  However given the pleuritic nature of the pain we will obtain a D-dimer in addition to cardiac enzymes and basic labs.  ----------------------------------------- 1:13 PM on 02/22/2021 -----------------------------------------  Lab work-up is unremarkable including negative troponins x2 and a negative D-dimer.  On reassessment the patient appears comfortable and states she is feeling relatively well.  Her blood pressure has normalized and other vital signs remain stable.  She is stable for discharge at this time.  Return precautions given, and she expresses understanding.   ____________________________________________   FINAL CLINICAL IMPRESSION(S) / ED DIAGNOSES  Final diagnoses:  Atypical chest pain      NEW MEDICATIONS STARTED DURING THIS VISIT:  New Prescriptions   No medications on file     Note:  This document was prepared using Dragon voice recognition software and may include unintentional dictation errors.    Dionne Bucy, MD 02/22/21 1313

## 2021-02-22 NOTE — ED Triage Notes (Signed)
Pt to ER via POV with complaints of right sided chest pain that radiates into right shoulder and upper back, associated with shortness of breath and lightheadedness. Reports intermittently her fingers feel numb and tingle. Pt reports laying down and taking her BP, as high 170/122 at home. Hx of HTN six years ago when pregnant but no other blood pressure issues.

## 2021-02-22 NOTE — Discharge Instructions (Addendum)
There are newReturn to the ER for new, worsening, or persistent severe chest pain or discomfort, tightness, difficulty breathing, weakness or lightheadedness, or worsening symptoms that concern you.  Follow-up with your primary care doctor.

## 2021-02-22 NOTE — ED Notes (Signed)
Urine POC pregnancy is negative.

## 2021-03-06 ENCOUNTER — Ambulatory Visit: Payer: Self-pay | Admitting: Family Medicine

## 2021-04-18 ENCOUNTER — Ambulatory Visit: Payer: Self-pay | Admitting: Dermatology

## 2021-05-17 ENCOUNTER — Other Ambulatory Visit: Payer: Self-pay

## 2021-05-17 ENCOUNTER — Ambulatory Visit (INDEPENDENT_AMBULATORY_CARE_PROVIDER_SITE_OTHER): Payer: BLUE CROSS/BLUE SHIELD | Admitting: Family Medicine

## 2021-05-17 ENCOUNTER — Encounter: Payer: Self-pay | Admitting: Family Medicine

## 2021-05-17 VITALS — BP 143/92 | HR 67 | Resp 16 | Ht 64.0 in | Wt 176.7 lb

## 2021-05-17 DIAGNOSIS — Z23 Encounter for immunization: Secondary | ICD-10-CM

## 2021-05-17 DIAGNOSIS — Z1231 Encounter for screening mammogram for malignant neoplasm of breast: Secondary | ICD-10-CM | POA: Diagnosis not present

## 2021-05-17 DIAGNOSIS — Z0000 Encounter for general adult medical examination without abnormal findings: Secondary | ICD-10-CM

## 2021-05-17 DIAGNOSIS — F5104 Psychophysiologic insomnia: Secondary | ICD-10-CM

## 2021-05-17 MED ORDER — TRAZODONE HCL 50 MG PO TABS: 50.0000 mg | ORAL_TABLET | Freq: Every day | ORAL | 3 refills | Status: DC

## 2021-05-17 NOTE — Addendum Note (Signed)
Addended by: Fonda Kinder on: 05/17/2021 04:08 PM   Modules accepted: Orders

## 2021-05-17 NOTE — Assessment & Plan Note (Signed)
UTD on dental and eye appts Things to do to keep yourself healthy  - Exercise at least 30-45 minutes a day, 3-4 days a week.  - Eat a low-fat diet with lots of fruits and vegetables, up to 7-9 servings per day.  - Seatbelts can save your life. Wear them always.  - Smoke detectors on every level of your home, check batteries every year.  - Eye Doctor - have an eye exam every 1-2 years  - Safe sex - if you may be exposed to STDs, use a condom.  - Alcohol -  If you drink, do it moderately, less than 2 drinks per day.  - Health Care Power of Attorney. Choose someone to speak for you if you are not able.  - Depression is common in our stressful world.If you're feeling down or losing interest in things you normally enjoy, please come in for a visit.  - Violence - If anyone is threatening or hurting you, please call immediately.

## 2021-05-17 NOTE — Progress Notes (Signed)
Complete physical exam   Patient: Summer Shaffer   DOB: 07/29/1980   40 y.o. Female  MRN: [id number] Visit Date: 05/17/2021  Today's healthcare provider: Jacky Kindle, FNP   Chief Complaint  Patient presents with   Annual Exam   Subjective     HPI  Summer Shaffer is a 40 y.o. female who presents today for a complete physical exam.  She reports consuming a general diet. The patient does not participate in regular exercise at present. She generally feels well. She reports sleeping fairly well. She does not have additional problems to discuss today.   Patient request refill of trazodone for incontinence; advice provided to reduce stimulant beverages to address urge incontinence and follow-up with additional OV if needed.  Last reported Pap- 11/30/2018  Past Medical History:  Diagnosis Date   Allergy    Anemia    Anxiety    Migraines    Pregnancy induced hypertension    SUI (stress urinary incontinence, female)    Vitamin D deficiency    Past Surgical History:  Procedure Laterality Date   TONSILLECTOMY AND ADENOIDECTOMY     Social History   Socioeconomic History   Marital status: Married    Spouse name: Not on file   Number of children: 1   Years of education: Not on file   Highest education level: Not on file  Occupational History   Not on file  Tobacco Use   Smoking status: Never   Smokeless tobacco: Never  Vaping Use   Vaping Use: Never used  Substance and Sexual Activity   Alcohol use: Not Currently    Comment: occasional, nothing every month   Drug use: Never   Sexual activity: Yes    Partners: Male    Birth control/protection: I.U.D.  Other Topics Concern   Not on file  Social History Narrative   Not on file   Social Determinants of Health   Financial Resource Strain: Not on file  Food Insecurity: Not on file  Transportation Needs: Not on file  Physical Activity: Not on file  Stress: Not on file  Social Connections: Not on file   Intimate Partner Violence: Not on file   Family Status  Relation Name Status   Mother  Alive   Father  Alive   Sister  Alive   MGM  Alive   MGF  Deceased   PGM  Deceased   PGF  Alive   Brother  Alive   Daughter  Alive   Neg Hx  (Not Specified)   Family History  Problem Relation Age of Onset   Diabetes Mother    Hypertension Mother    Sleep apnea Mother    Hypertension Father    Other Father        Antiphospholipid   Hypertension Sister    Thyroid disease Maternal Grandmother    Skin cancer Maternal Grandmother    Diabetes Maternal Grandfather    Glaucoma Maternal Grandfather    Diabetes Paternal Grandmother    Skin cancer Paternal Grandfather    Obesity Brother    Hypertension Brother    Insomnia Brother    Sleep apnea Brother    Breast cancer Neg Hx    Colon cancer Neg Hx    Allergies  Allergen Reactions   Codeine Other (See Comments)    Hallucinations   Penicillins Rash    AS A BABY     Patient Care Team: Erasmo Downer, MD as PCP - General (  Family Medicine)   Medications: Outpatient Medications Prior to Visit  Medication Sig   Cholecalciferol (VITAMIN D3) 1000 units CAPS Take 1 capsule by mouth daily.   ferrous sulfate 325 (65 FE) MG EC tablet Take 325 mg by mouth 3 (three) times daily with meals.   fluticasone (FLONASE) 50 MCG/ACT nasal spray Place into both nostrils daily.   levonorgestrel (MIRENA) 20 MCG/24HR IUD 1 each by Intrauterine route once.   loratadine (CLARITIN) 10 MG tablet Take by mouth.   Multiple Vitamin (MULTIVITAMIN) tablet Take 1 tablet by mouth daily.   rizatriptan (MAXALT) 5 MG tablet Take 1 tablet (5 mg total) by mouth as needed for migraine. May repeat in 2 hours if needed   [DISCONTINUED] cyclobenzaprine (FLEXERIL) 10 MG tablet Take 1 tablet (10 mg total) by mouth 3 (three) times daily as needed for muscle spasms.   [DISCONTINUED] traZODone (DESYREL) 50 MG tablet TAKE 0.5-1 TABLETS BY MOUTH AT BEDTIME AS NEEDED FOR SLEEP.    [DISCONTINUED] fluconazole (DIFLUCAN) 150 MG tablet Take 1 tablet (150 mg total) by mouth every three (3) days as needed.   No facility-administered medications prior to visit.    Review of Systems  HENT:  Positive for postnasal drip.   Endocrine: Positive for polyuria.  Genitourinary:  Positive for frequency.  Musculoskeletal:  Positive for neck stiffness.  Allergic/Immunologic: Positive for environmental allergies.  Psychiatric/Behavioral:  Positive for sleep disturbance.   All other systems reviewed and are negative.    Objective    BP (!) 143/92   Pulse 67   Resp 16   Ht 5\' 4"  (1.626 m)   Wt 176 lb 11.2 oz (80.2 kg)   SpO2 100%   BMI 30.33 kg/m    Physical Exam Vitals and nursing note reviewed.  Constitutional:      General: She is awake. She is not in acute distress.    Appearance: Normal appearance. She is well-developed and well-groomed. She is obese. She is not ill-appearing, toxic-appearing or diaphoretic.  HENT:     Head: Normocephalic and atraumatic.     Jaw: There is normal jaw occlusion. No trismus, tenderness, swelling or pain on movement.     Right Ear: Hearing, tympanic membrane, ear canal and external ear normal. There is no impacted cerumen.     Left Ear: Hearing, tympanic membrane, ear canal and external ear normal. There is no impacted cerumen.     Nose: Nose normal. No congestion or rhinorrhea.     Right Turbinates: Not enlarged, swollen or pale.     Left Turbinates: Not enlarged, swollen or pale.     Right Sinus: No maxillary sinus tenderness or frontal sinus tenderness.     Left Sinus: No maxillary sinus tenderness or frontal sinus tenderness.     Mouth/Throat:     Lips: Pink.     Mouth: Mucous membranes are moist. No injury.     Tongue: No lesions.     Pharynx: Oropharynx is clear. Uvula midline. No pharyngeal swelling, oropharyngeal exudate, posterior oropharyngeal erythema or uvula swelling.     Tonsils: No tonsillar exudate or tonsillar  abscesses.  Eyes:     General: Lids are normal. Lids are everted, no foreign bodies appreciated. Vision grossly intact. Gaze aligned appropriately. No allergic shiner or visual field deficit.       Right eye: No discharge.        Left eye: No discharge.     Extraocular Movements: Extraocular movements intact.     Conjunctiva/sclera: Conjunctivae normal.  Right eye: Right conjunctiva is not injected. No exudate.    Left eye: Left conjunctiva is not injected. No exudate.    Pupils: Pupils are equal, round, and reactive to light.  Neck:     Thyroid: No thyroid mass, thyromegaly or thyroid tenderness.     Vascular: No carotid bruit.     Trachea: Trachea normal.  Cardiovascular:     Rate and Rhythm: Normal rate and regular rhythm.     Pulses: Normal pulses.          Carotid pulses are 2+ on the right side and 2+ on the left side.      Radial pulses are 2+ on the right side and 2+ on the left side.       Dorsalis pedis pulses are 2+ on the right side and 2+ on the left side.       Posterior tibial pulses are 2+ on the right side and 2+ on the left side.     Heart sounds: Normal heart sounds, S1 normal and S2 normal. No murmur heard.   No friction rub. No gallop.  Pulmonary:     Effort: Pulmonary effort is normal. No respiratory distress.     Breath sounds: Normal breath sounds and air entry. No stridor. No wheezing, rhonchi or rales.  Chest:     Chest wall: No tenderness.     Comments: Breasts: breasts appear normal, no suspicious masses, no skin or nipple changes or axillary nodes, right breast normal without mass, skin or nipple changes or axillary nodes, left breast normal without mass, skin or nipple changes or axillary nodes, risk and benefit of breast self-exam was discussed  Abdominal:     General: Abdomen is flat. Bowel sounds are normal. There is no distension.     Palpations: Abdomen is soft. There is no mass.     Tenderness: There is no abdominal tenderness. There is no right  CVA tenderness, left CVA tenderness, guarding or rebound.     Hernia: No hernia is present.  Genitourinary:    Comments: Exam deferred; advice provided to reduce stimulant beverages to address urge incontinence and follow-up with additional OV if needed. Musculoskeletal:        General: No swelling, tenderness, deformity or signs of injury. Normal range of motion.     Cervical back: Full passive range of motion without pain, normal range of motion and neck supple. No edema, rigidity or tenderness. No muscular tenderness.     Right lower leg: No edema.     Left lower leg: No edema.  Lymphadenopathy:     Cervical: No cervical adenopathy.     Right cervical: No superficial, deep or posterior cervical adenopathy.    Left cervical: No superficial, deep or posterior cervical adenopathy.  Skin:    General: Skin is warm and dry.     Capillary Refill: Capillary refill takes less than 2 seconds.     Coloration: Skin is not jaundiced or pale.     Findings: No bruising, erythema, lesion or rash.  Neurological:     General: No focal deficit present.     Mental Status: She is alert and oriented to person, place, and time. Mental status is at baseline.     GCS: GCS eye subscore is 4. GCS verbal subscore is 5. GCS motor subscore is 6.     Sensory: Sensation is intact. No sensory deficit.     Motor: Motor function is intact. No weakness.     Coordination:  Coordination is intact. Coordination normal.     Gait: Gait is intact. Gait normal.  Psychiatric:        Attention and Perception: Attention and perception normal.        Mood and Affect: Mood and affect normal.        Speech: Speech normal.        Behavior: Behavior normal. Behavior is cooperative.        Thought Content: Thought content normal.        Cognition and Memory: Cognition and memory normal.        Judgment: Judgment normal.      Last depression screening scores PHQ 2/9 Scores 05/17/2021 05/15/2020 05/11/2019  PHQ - 2 Score 0 1 0   PHQ- 9 Score 4 7 8    Last fall risk screening Fall Risk  05/17/2021  Falls in the past year? 1  Number falls in past yr: 1  Injury with Fall? 0  Risk for fall due to : -  Follow up -   Last Audit-C alcohol use screening Alcohol Use Disorder Test (AUDIT) 05/17/2021  1. How often do you have a drink containing alcohol? 2  2. How many drinks containing alcohol do you have on a typical day when you are drinking? 0  3. How often do you have six or more drinks on one occasion? 0  AUDIT-C Score 2  Alcohol Brief Interventions/Follow-up -   A score of 3 or more in women, and 4 or more in men indicates increased risk for alcohol abuse, EXCEPT if all of the points are from question 1   No results found for any visits on 05/17/21.  Assessment & Plan    Routine Health Maintenance and Physical Exam  Exercise Activities and Dietary recommendations  Goals   None     Immunization History  Administered Date(s) Administered   Influenza,inj,Quad PF,6+ Mos 05/15/2020   PFIZER(Purple Top)SARS-COV-2 Vaccination 09/24/2019, 10/19/2019   Tdap 12/30/2014    Health Maintenance  Topic Date Due   Hepatitis C Screening  Never done   COVID-19 Vaccine (3 - Booster for Pfizer series) 12/14/2019   INFLUENZA VACCINE  01/29/2021   PAP SMEAR-Modifier  11/29/2021   TETANUS/TDAP  12/29/2024   HIV Screening  Completed   Pneumococcal Vaccine 12-28 Years old  Aged Out   HPV VACCINES  Aged Out    Discussed health benefits of physical activity, and encouraged her to engage in regular exercise appropriate for her age and condition.  Problem List Items Addressed This Visit       Other   Psychophysiological insomnia    Pulled for refill      Relevant Medications   traZODone (DESYREL) 50 MG tablet   Annual physical exam - Primary    UTD on dental and eye appts Things to do to keep yourself healthy  - Exercise at least 30-45 minutes a day, 3-4 days a week.  - Eat a low-fat diet with lots of fruits  and vegetables, up to 7-9 servings per day.  - Seatbelts can save your life. Wear them always.  - Smoke detectors on every level of your home, check batteries every year.  - Eye Doctor - have an eye exam every 1-2 years  - Safe sex - if you may be exposed to STDs, use a condom.  - Alcohol -  If you drink, do it moderately, less than 2 drinks per day.  - Health Care Power of Attorney. Choose someone to speak  for you if you are not able.  - Depression is common in our stressful world.If you're feeling down or losing interest in things you normally enjoy, please come in for a visit.  - Violence - If anyone is threatening or hurting you, please call immediately.        Screening mammogram for breast cancer    First screening mammogram      Relevant Orders   MM 3D SCREEN BREAST BILATERAL   Flu vaccine need     Return in about 1 year (around 05/17/2022) for annual examination.     Leilani Merl, FNP, have reviewed all documentation for this visit. The documentation on 05/17/21 for the exam, diagnosis, procedures, and orders are all accurate and complete.    Jacky Kindle, FNP  [facility] [phone] (phone) [phone] (fax)  [facility]

## 2021-05-17 NOTE — Assessment & Plan Note (Signed)
First screening mammogram

## 2021-05-17 NOTE — Assessment & Plan Note (Signed)
Pulled for refill 

## 2022-05-27 ENCOUNTER — Encounter: Payer: BLUE CROSS/BLUE SHIELD | Admitting: Family Medicine

## 2022-06-11 ENCOUNTER — Telehealth: Payer: Self-pay

## 2022-06-12 ENCOUNTER — Encounter: Payer: Self-pay | Admitting: Family Medicine

## 2022-06-12 ENCOUNTER — Ambulatory Visit: Payer: BLUE CROSS/BLUE SHIELD | Admitting: Family Medicine

## 2022-06-12 VITALS — BP 136/105 | HR 86 | Resp 16 | Ht 64.0 in | Wt 186.0 lb

## 2022-06-12 DIAGNOSIS — G43811 Other migraine, intractable, with status migrainosus: Secondary | ICD-10-CM

## 2022-06-12 DIAGNOSIS — I10 Essential (primary) hypertension: Secondary | ICD-10-CM | POA: Diagnosis not present

## 2022-06-12 DIAGNOSIS — G43909 Migraine, unspecified, not intractable, without status migrainosus: Secondary | ICD-10-CM | POA: Insufficient documentation

## 2022-06-12 MED ORDER — NURTEC 75 MG PO TBDP: 75.0000 mg | ORAL_TABLET | Freq: Every day | ORAL | 0 refills | Status: DC | PRN

## 2022-06-12 MED ORDER — CYCLOBENZAPRINE HCL 10 MG PO TABS: 10.0000 mg | ORAL_TABLET | Freq: Three times a day (TID) | ORAL | 0 refills | Status: DC | PRN

## 2022-06-12 MED ORDER — RIZATRIPTAN BENZOATE 5 MG PO TABS: 5.0000 mg | ORAL_TABLET | ORAL | 0 refills | Status: DC | PRN

## 2022-06-12 NOTE — Assessment & Plan Note (Signed)
Acute, recurrent Denies stress or anxiety No illness BP elevated in s/o pain and getting to appt from work on time Trial of nurtec Refill of flexeril Refill of maxalt  Seek emergent care as needed

## 2022-06-12 NOTE — Assessment & Plan Note (Signed)
Currently rating tension headache/migraine of 7/10 Encouraged follow up once stable for CPE

## 2022-06-12 NOTE — Progress Notes (Signed)
Established patient visit   Patient: Summer Shaffer   DOB: Dec 04, 1980   41 y.o. Female  MRN: [id number] Visit Date: 06/12/2022  Today's healthcare provider: Jacky Kindle, FNP  Re Introduced to nurse practitioner role and practice setting.  All questions answered.  Discussed provider/patient relationship and expectations.  I,Tiffany J Bragg,acting as a scribe for Jacky Kindle, FNP.,have documented all relevant documentation on the behalf of Jacky Kindle, FNP,as directed by  Jacky Kindle, FNP while in the presence of Jacky Kindle, FNP.  Chief Complaint  Patient presents with   Headache    Patient complains of having a headache for 6 days. Says she goes to the chiropractor and they said her neck and back are inflamed. Advil is not working anymore.    Subjective    HPI HPI     Headache    Additional comments: Patient complains of having a headache for 6 days. Says she goes to the chiropractor and they said her neck and back are inflamed. Advil is not working anymore.       Last edited by Marlana Salvage, CMA on 06/12/2022  4:08 PM.      Medications: Outpatient Medications Prior to Visit  Medication Sig   levonorgestrel (MIRENA) 20 MCG/24HR IUD 1 each by Intrauterine route once.   loratadine (CLARITIN) 10 MG tablet Take by mouth.   Multiple Vitamin (MULTIVITAMIN) tablet Take 1 tablet by mouth daily.   traZODone (DESYREL) 50 MG tablet Take 1 tablet (50 mg total) by mouth at bedtime.   Cholecalciferol (VITAMIN D3) 1000 units CAPS Take 1 capsule by mouth daily. (Patient not taking: Reported on 06/12/2022)   ferrous sulfate 325 (65 FE) MG EC tablet Take 325 mg by mouth 3 (three) times daily with meals. (Patient not taking: Reported on 06/12/2022)   fluticasone (FLONASE) 50 MCG/ACT nasal spray Place into both nostrils daily. (Patient not taking: Reported on 06/12/2022)   [DISCONTINUED] rizatriptan (MAXALT) 5 MG tablet Take 1 tablet (5 mg total) by mouth as needed for  migraine. May repeat in 2 hours if needed (Patient not taking: Reported on 06/12/2022)   No facility-administered medications prior to visit.   Review of Systems    Objective    BP (!) 136/105 (BP Location: Right Arm, Patient Position: Sitting, Cuff Size: Normal)   Pulse 86   Resp 16   Ht 5\' 4"  (1.626 m)   Wt 186 lb (84.4 kg)   SpO2 98%   BMI 31.93 kg/m   Physical Exam Vitals and nursing note reviewed.  Constitutional:      General: She is not in acute distress.    Appearance: Normal appearance. She is well-developed. She is obese. She is not ill-appearing, toxic-appearing or diaphoretic.  HENT:     Head: Normocephalic and atraumatic.   Eyes:     Extraocular Movements: Extraocular movements intact.  Neck:   Cardiovascular:     Rate and Rhythm: Normal rate and regular rhythm.     Pulses: Normal pulses.     Heart sounds: Normal heart sounds. No murmur heard.    No friction rub. No gallop.  Pulmonary:     Effort: Pulmonary effort is normal. No respiratory distress.     Breath sounds: Normal breath sounds. No stridor. No wheezing, rhonchi or rales.  Chest:     Chest wall: No tenderness.  Musculoskeletal:        General: Tenderness present. No swelling, deformity or signs of injury.  Normal range of motion.       Arms:     Cervical back: Tenderness present.     Right lower leg: No edema.     Left lower leg: No edema.  Skin:    General: Skin is warm and dry.     Capillary Refill: Capillary refill takes less than 2 seconds.     Coloration: Skin is not jaundiced or pale.     Findings: No bruising, erythema, lesion or rash.  Neurological:     General: No focal deficit present.     Mental Status: She is alert and oriented to person, place, and time. Mental status is at baseline.     GCS: GCS eye subscore is 4. GCS verbal subscore is 5. GCS motor subscore is 6.     Cranial Nerves: No cranial nerve deficit.     Sensory: No sensory deficit.     Motor: No weakness.      Coordination: Coordination normal.  Psychiatric:        Mood and Affect: Mood normal.        Speech: Speech normal.        Behavior: Behavior normal.        Thought Content: Thought content normal.        Judgment: Judgment normal.     No results found for any visits on 06/12/22.  Assessment & Plan     Problem List Items Addressed This Visit       Cardiovascular and Mediastinum   Elevated blood pressure reading in office with diagnosis of hypertension    Currently rating tension headache/migraine of 7/10 Encouraged follow up once stable for CPE      Migraine - Primary    Acute, recurrent Denies stress or anxiety No illness BP elevated in s/o pain and getting to appt from work on time Trial of nurtec Refill of flexeril Refill of maxalt  Seek emergent care as needed       Relevant Medications   rizatriptan (MAXALT) 5 MG tablet   Rimegepant Sulfate (NURTEC) 75 MG TBDP   cyclobenzaprine (FLEXERIL) 10 MG tablet   Return if symptoms worsen or fail to improve.     Leilani Merl, FNP, have reviewed all documentation for this visit. The documentation on 06/12/22 for the exam, diagnosis, procedures, and orders are all accurate and complete.  Jacky Kindle, FNP  [facility] [phone] (phone) [phone] (fax)  [facility]

## 2022-06-23 ENCOUNTER — Ambulatory Visit
Admission: EM | Admit: 2022-06-23 | Discharge: 2022-06-23 | Disposition: A | Discharge status: Home / Self Care | Payer: BLUE CROSS/BLUE SHIELD | Attending: Urgent Care | Admitting: Urgent Care

## 2022-06-23 DIAGNOSIS — J069 Acute upper respiratory infection, unspecified: Secondary | ICD-10-CM | POA: Diagnosis not present

## 2022-06-23 DIAGNOSIS — J0100 Acute maxillary sinusitis, unspecified: Secondary | ICD-10-CM | POA: Diagnosis not present

## 2022-06-23 MED ORDER — AZITHROMYCIN 250 MG PO TABS: ORAL_TABLET | ORAL | 0 refills | Status: DC

## 2022-06-23 MED ORDER — BENZONATATE 100 MG PO CAPS: ORAL_CAPSULE | ORAL | 0 refills | Status: DC

## 2022-06-23 MED ORDER — PREDNISONE 20 MG PO TABS: ORAL_TABLET | ORAL | 0 refills | Status: AC

## 2022-06-23 NOTE — ED Triage Notes (Signed)
Pt. Presents to UC w/ c/o a cough, fever and a sore throat for the past 5 days. Pt. Has been taking OTC medication for symptom control

## 2022-06-23 NOTE — ED Provider Notes (Addendum)
Summer Shaffer    CSN: [id number] Arrival date & time: 06/23/22  2725      History   Chief Complaint Chief Complaint  Patient presents with   Cough   Sore Throat   Fever    HPI Summer Shaffer is a 41 y.o. female.    Cough Associated symptoms: fever   Sore Throat  Fever Associated symptoms: cough     Presents with complaint of cough, fever, sore throat x 1 week.  Endorses sinus pain and pressure with some popping in her ears and pain on the sides of her face below her ears.  Fever is resolved a few days ago but was as high as 104 F.  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 Authorizing Provider  Cholecalciferol (VITAMIN D3) 1000 units CAPS Take 1 capsule by mouth daily. Patient not taking: Reported on 06/12/2022    [provider]  cyclobenzaprine (FLEXERIL) 10 MG tablet Take 1 tablet (10 mg total) by mouth 3 (three) times daily as  needed for muscle spasms. 06/12/22   Jacky Kindle, FNP  ferrous sulfate 325 (65 FE) MG EC tablet Take 325 mg by mouth 3 (three) times daily with meals. Patient not taking: Reported on 06/12/2022    [provider]  fluticasone (FLONASE) 50 MCG/ACT nasal spray Place into both nostrils daily. Patient not taking: Reported on 06/12/2022    [provider]  levonorgestrel (MIRENA) 20 MCG/24HR IUD 1 each by Intrauterine route once.    [provider]  loratadine (CLARITIN) 10 MG tablet Take by mouth.    [provider]  Multiple Vitamin (MULTIVITAMIN) tablet Take 1 tablet by mouth daily.    [provider]  Rimegepant Sulfate (NURTEC) 75 MG TBDP Take 75 mg by mouth daily as needed. 06/12/22   Jacky Kindle, FNP  rizatriptan (MAXALT) 5 MG tablet Take 1 tablet (5 mg total) by mouth as needed for migraine. May repeat in 2 hours if needed 06/12/22   Jacky Kindle, FNP  traZODone (DESYREL) 50 MG tablet Take 1 tablet (50 mg total) by mouth at bedtime. 05/17/21   Suzie Portela,  Daryl Eastern, FNP    Family History Family History  Problem Relation Age of Onset   Diabetes Mother    Hypertension Mother    Sleep apnea Mother    Hypertension Father    Other Father        Antiphospholipid   Hypertension Sister    Thyroid disease Maternal Grandmother    Skin cancer Maternal Grandmother    Diabetes Maternal Grandfather    Glaucoma Maternal Grandfather    Diabetes Paternal Grandmother    Skin cancer Paternal Grandfather    Obesity Brother    Hypertension Brother    Insomnia Brother    Sleep apnea Brother    Breast cancer Neg Hx    Colon cancer Neg Hx     Social History Social History   Tobacco Use   Smoking status: Never   Smokeless tobacco: Never  Vaping Use   Vaping Use: Never used  Substance Use Topics   Alcohol use: Not Currently    Comment: occasional, nothing every month   Drug use: Never     Allergies   Codeine and Penicillins   Review of  Systems Review of Systems  Constitutional:  Positive for fever.  Respiratory:  Positive for cough.      Physical Exam Triage Vital Signs ED Triage Vitals [06/23/22 0849]  Enc Vitals Group     BP 134/86     Pulse Rate 78     Resp 17     Temp 97.7 F (36.5 C)     Temp src      SpO2 97 %     Weight      Height      Head Circumference      Peak Flow      Pain Score 0     Pain Loc      Pain Edu?      Excl. in GC?    No data found.  Updated Vital Signs BP 134/86   Pulse 78   Temp 97.7 F (36.5 C)   Resp 17   LMP  (LMP Unknown)   SpO2 97%   Breastfeeding No   Visual Acuity Right Eye Distance:   Left Eye Distance:   Bilateral Distance:    Right Eye Near:   Left Eye Near:    Bilateral Near:     Physical Exam Vitals reviewed.  Constitutional:      Appearance: She is well-developed.  HENT:     Right Ear: Tympanic membrane normal.     Left Ear: Tympanic membrane normal.     Mouth/Throat:     Mouth: Mucous membranes are moist.     Pharynx: Posterior oropharyngeal erythema present. No oropharyngeal exudate.  Cardiovascular:     Rate and Rhythm: Normal rate and regular rhythm.     Heart sounds: Normal heart sounds.  Pulmonary:     Effort: Pulmonary effort is normal.     Breath sounds: Normal breath sounds.  Skin:    General: Skin is warm.  Neurological:     General: No focal deficit present.     Mental Status: She is alert and oriented to person, place, and time.  Psychiatric:        Mood and Affect: Mood normal.        Behavior: Behavior normal.      UC Treatments / Results  Labs (all labs ordered are listed, but only abnormal results are displayed) Labs Reviewed - No data to display  EKG  Radiology No results found.  Procedures Procedures (including critical care time)  Medications Ordered in UC Medications - No data to display  Initial Impression / Assessment and Plan / UC Course  I have reviewed the triage vital signs and the nursing  notes.  Pertinent labs & imaging results that were available during my care of the patient were reviewed by me and considered in my medical decision making (see chart for details).   Patient is afebrile here without recent antipyretics. Satting well on room air. Overall is ill appearing, well hydrated, without respiratory distress.  Moist sounding cough is present throughout the exam and fits of coughing are triggered by deep breathing during exam.  Pulmonary exam is unremarkable except for cough.  Lungs CTAB without wheezing, rhonchi, rales.  TMs are WNL bilaterally.  Mild pharyngeal erythema is present without peritonsillar exudates.  Suspect viral process including influenza.  She is far outside the window of treatment with antiviral therapy and likely coming to the end of her viral illness though still dealing with significant sinusitis and bronchitis with chest congestion.  Recommending use of Mucinex (guaifenesin) to thin and expectorate mucus secretions in her chest and upper respiratory.  Will prescribe benzonatate for cough.  She has small children at home so will forego giving sedating cough treatment.  Given her symptoms now over 1 week concern for new or impending bacterial secondary infection and will prescribe azithromycin to head that off.  Will also prescribe a prednisone taper to reduce sinus and bronchial inflammation and try to prevent secondary infection from occurring.  Final Clinical Impressions(s) / UC Diagnoses   Final diagnoses:  None   Discharge Instructions   None    ED Prescriptions   None    PDMP not reviewed this encounter.   Charma Igo, FNP 06/23/22 0906    Charma Igo, FNP 06/23/22 [phone]

## 2022-06-23 NOTE — Discharge Instructions (Signed)
You have been diagnosed with a past viral upper respiratory infection based on your symptoms and exam.  I have prescribed an antibiotic because of the concern that you have developed a secondary bacterial infection or will soon.  I have also prescribed a course of prednisone to reduce sinus and bronchial inflammation and control your symptoms.  Get plenty of rest and non-caffeinated fluids. Watch for signs of dehydration including reduced urine output and dark colored urine.  We recommend you use over-the-counter medications for symptom control including Tylenol or ibuprofen for fever, chills or body aches, and cold/cough medication.  Benzonatate has been prescribed to help control your cough.  Saline mist spray is helpful for removing excess mucus from your nose.  Room humidifiers are helpful to ease breathing at night. I recommend guaifenesin (Mucinex) to help thin and loosen mucus secretions in your respiratory passages.   If appropriate based upon your other medical problems, you might also find relief of nasal/sinus congestion symptoms by using a nasal decongestant such as Flonase (fluticasone) or Sudafed sinus (pseudoephedrine).  You will need to obtain Sudafed from behind the pharmacist counter.  Speak to the pharmacist to verify that you are not duplicating medications with other over-the-counter formulations that you may be using.   Follow up here or with your primary care provider if your symptoms are worsening or not improving with treatment.

## 2022-07-09 ENCOUNTER — Other Ambulatory Visit: Payer: Self-pay | Admitting: Family Medicine

## 2022-07-09 DIAGNOSIS — G43811 Other migraine, intractable, with status migrainosus: Secondary | ICD-10-CM

## 2022-07-09 NOTE — Telephone Encounter (Signed)
Requested medication (s) are due for refill today: 06/12/22  Requested medication (s) are on the active medication list: yes   Last refill:  06/12/22 #10 0 refills  Future visit scheduled: no   Notes to clinic:  no refills remain. Do you want to refill Rx?     Requested Prescriptions  Pending Prescriptions Disp Refills   rizatriptan (MAXALT) 5 MG tablet [Pharmacy Med Name: RIZATRIPTAN 5 MG TABLET] 10 tablet 0    Sig: TAKE 1 TABLET BY MOUTH AS NEEDED FOR MIGRAINE. MAY REPEAT IN 2 HOURS IF NEEDED     Neurology:  Migraine Therapy - Triptan Passed - 07/09/2022  1:28 AM      Passed - Last BP in normal range    BP Readings from Last 1 Encounters:  06/23/22 134/86         Passed - Valid encounter within last 12 months    Recent Outpatient Visits           3 weeks ago Other migraine with status migrainosus, intractable   [facility] Gwyneth Sprout, FNP   1 year ago Annual physical exam   [facility] Gwyneth Sprout, FNP   1 year ago No-show for appointment   [facility], Dionne Bucy, MD   2 years ago Chronic tension-type headache, intractable   [facility], Dionne Bucy, MD

## 2022-11-29 ENCOUNTER — Other Ambulatory Visit: Payer: Self-pay | Admitting: Family Medicine

## 2022-11-29 DIAGNOSIS — Z1231 Encounter for screening mammogram for malignant neoplasm of breast: Secondary | ICD-10-CM

## 2022-12-02 IMAGING — CR DG CHEST 2V
1 series · 2 of 2 positions shown · non-contrast
Comparison: None.

CLINICAL DATA: 40-year-old female with right side chest pain
radiating to the shoulder and back with shortness of breath and
lightheadedness.

EXAM:
CHEST - 2 VIEW

[Series 1: dg chest 2 view · 0.14mm/px · 2 of 2 slices shown]
[im 1/2]
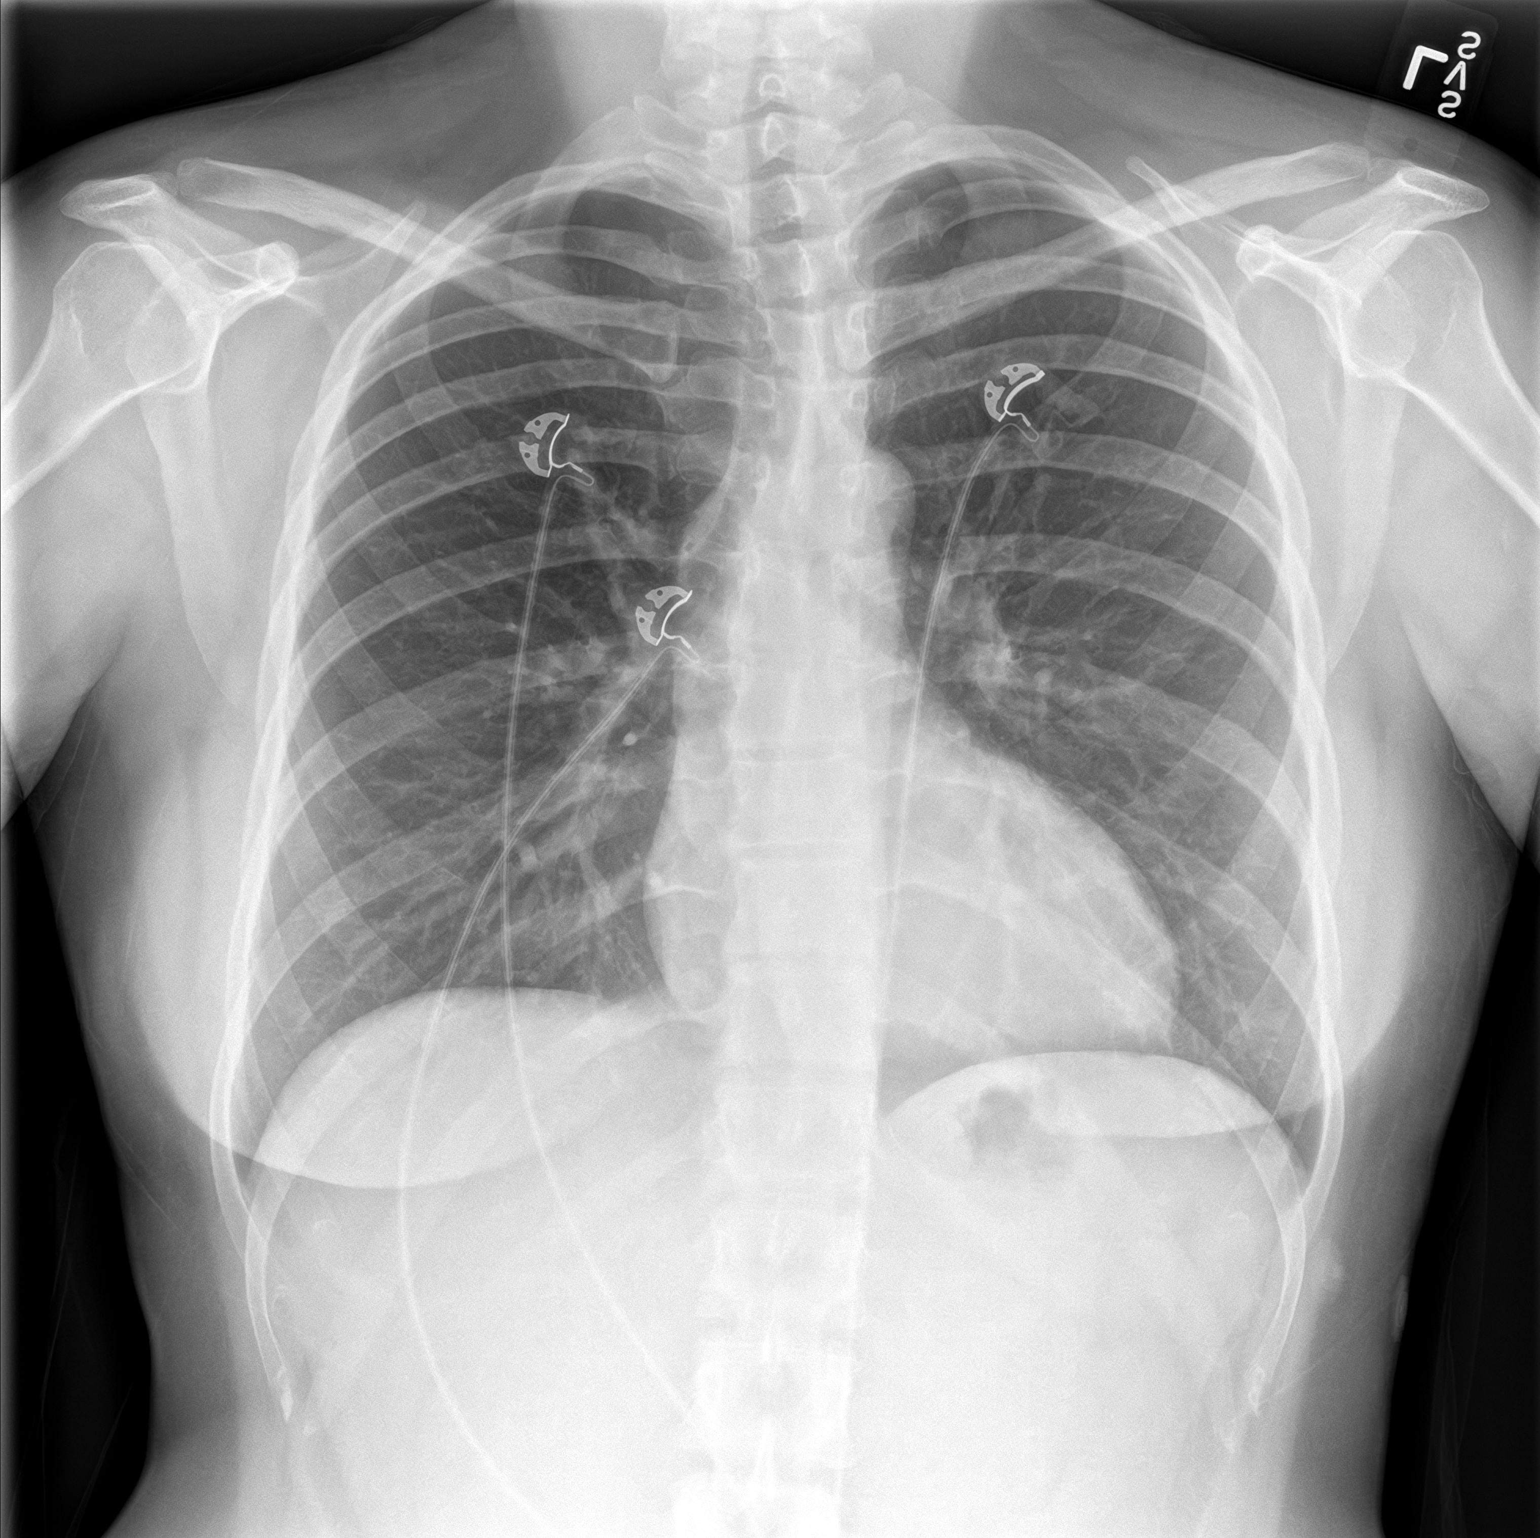
[im 2/2]
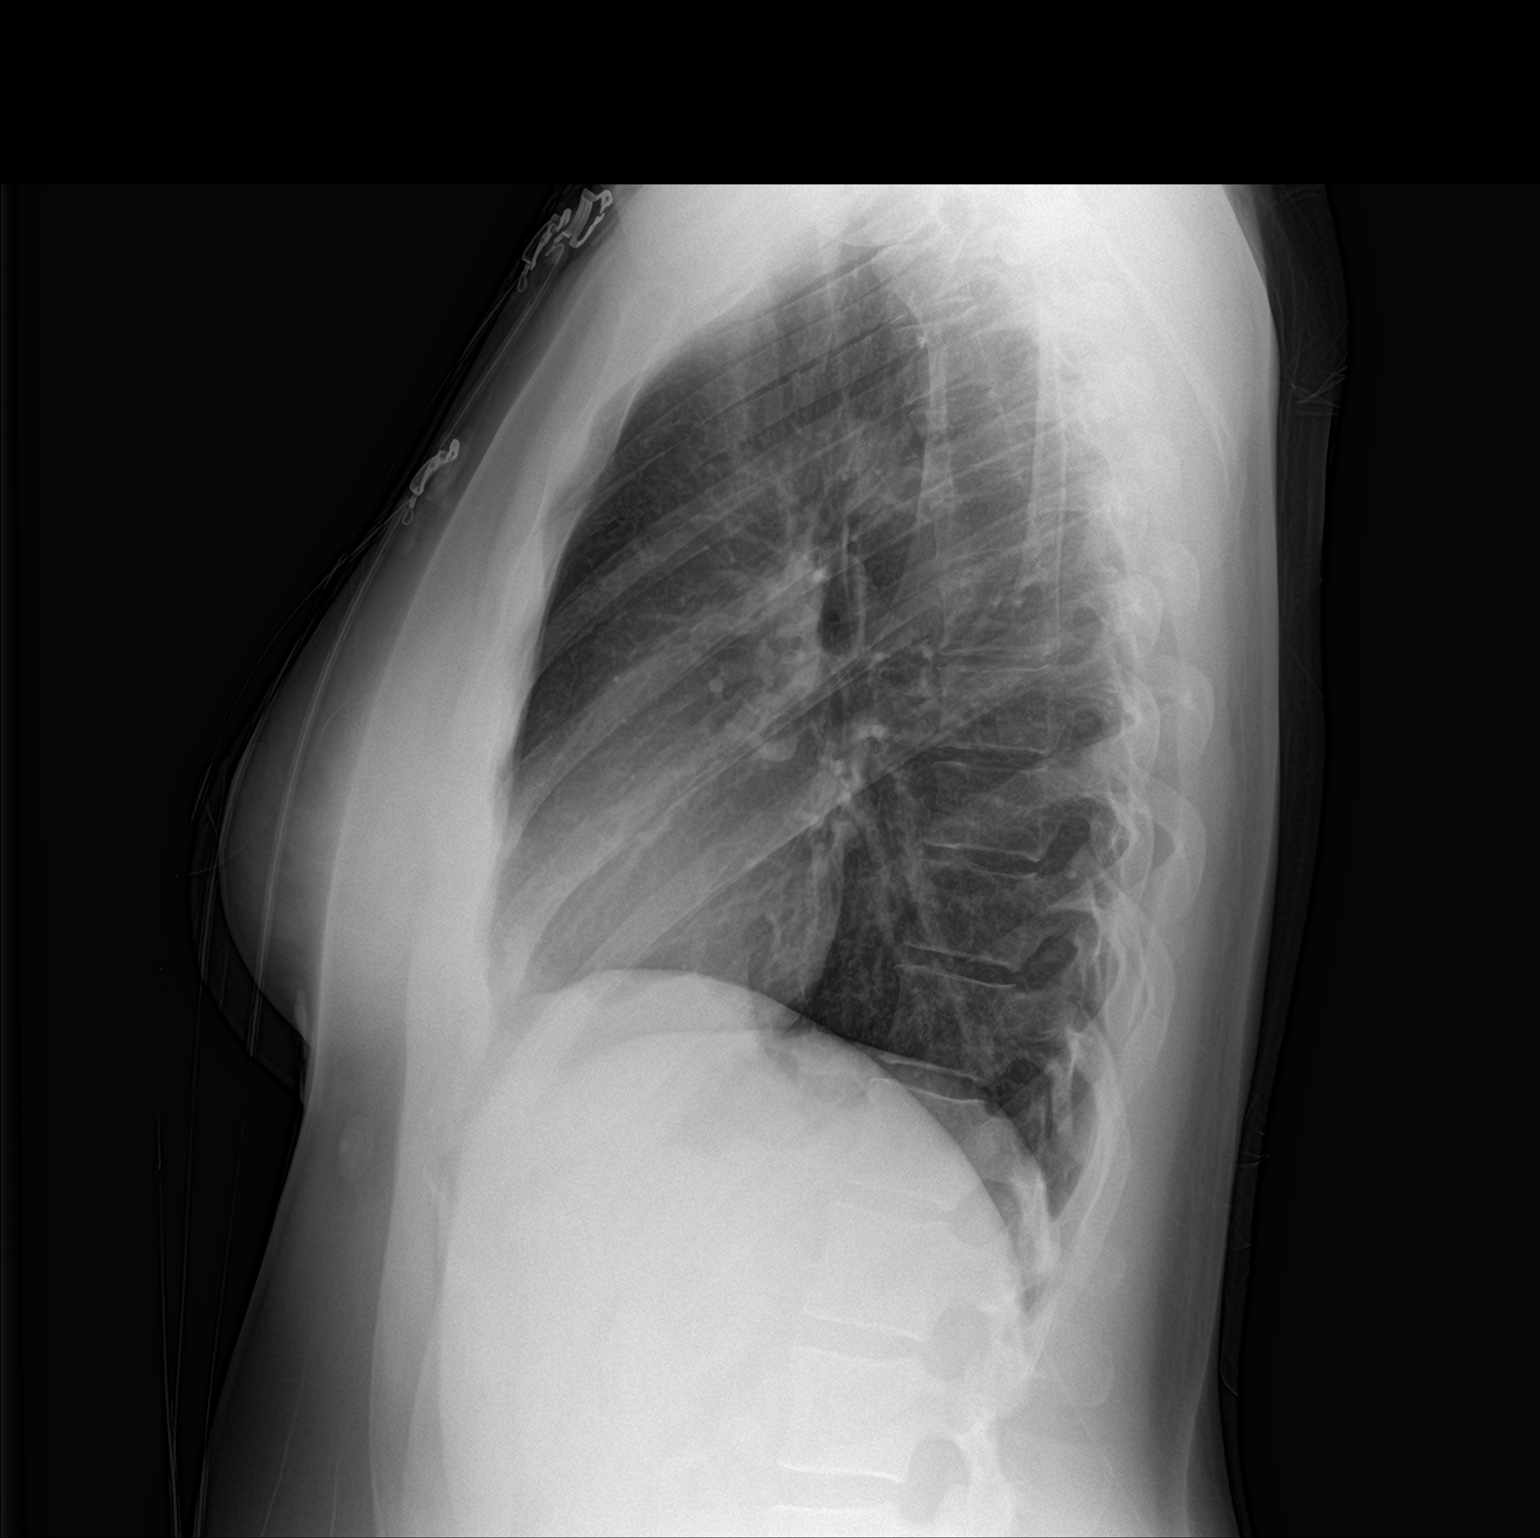

[2 of 2 positions shown; findings below may reference images not displayed]

FINDINGS: Normal lung volumes and mediastinal contours. Visualized tracheal
air column is within normal limits. Both lungs appear clear. No
pneumothorax or pleural effusion.

No acute osseous abnormality identified. Mild levoconvex upper
thoracic scoliosis. Paucity of bowel gas in the visible abdomen.
IMPRESSION: Negative.  No cardiopulmonary abnormality.

## 2022-12-18 ENCOUNTER — Ambulatory Visit
Admission: RE | Admit: 2022-12-18 | Discharge: 2022-12-18 | Disposition: A | Discharge status: Home / Self Care | Payer: BLUE CROSS/BLUE SHIELD | Source: Ambulatory Visit

## 2022-12-18 DIAGNOSIS — Z1231 Encounter for screening mammogram for malignant neoplasm of breast: Secondary | ICD-10-CM

## 2023-04-17 ENCOUNTER — Ambulatory Visit: Payer: BLUE CROSS/BLUE SHIELD | Admitting: Family Medicine

## 2023-04-17 VITALS — BP 132/89 | HR 88 | Temp 98.1°F | Ht 64.0 in | Wt 190.7 lb

## 2023-04-17 DIAGNOSIS — F5104 Psychophysiologic insomnia: Secondary | ICD-10-CM

## 2023-04-17 DIAGNOSIS — Z0000 Encounter for general adult medical examination without abnormal findings: Secondary | ICD-10-CM | POA: Diagnosis not present

## 2023-04-17 DIAGNOSIS — G5681 Other specified mononeuropathies of right upper limb: Secondary | ICD-10-CM

## 2023-04-17 DIAGNOSIS — Z6832 Body mass index (BMI) 32.0-32.9, adult: Secondary | ICD-10-CM | POA: Insufficient documentation

## 2023-04-17 DIAGNOSIS — G43811 Other migraine, intractable, with status migrainosus: Secondary | ICD-10-CM

## 2023-04-17 DIAGNOSIS — G44229 Chronic tension-type headache, not intractable: Secondary | ICD-10-CM | POA: Insufficient documentation

## 2023-04-17 DIAGNOSIS — R252 Cramp and spasm: Secondary | ICD-10-CM

## 2023-04-17 DIAGNOSIS — I10 Essential (primary) hypertension: Secondary | ICD-10-CM | POA: Insufficient documentation

## 2023-04-17 DIAGNOSIS — F4323 Adjustment disorder with mixed anxiety and depressed mood: Secondary | ICD-10-CM

## 2023-04-17 DIAGNOSIS — E559 Vitamin D deficiency, unspecified: Secondary | ICD-10-CM | POA: Insufficient documentation

## 2023-04-17 DIAGNOSIS — R7309 Other abnormal glucose: Secondary | ICD-10-CM

## 2023-04-17 DIAGNOSIS — G5682 Other specified mononeuropathies of left upper limb: Secondary | ICD-10-CM | POA: Diagnosis not present

## 2023-04-17 MED ORDER — CYCLOBENZAPRINE HCL 10 MG PO TABS: 10.0000 mg | ORAL_TABLET | Freq: Every day | ORAL | 0 refills | Status: DC

## 2023-04-17 MED ORDER — CELECOXIB 200 MG PO CAPS: 200.0000 mg | ORAL_CAPSULE | Freq: Two times a day (BID) | ORAL | 0 refills | Status: DC

## 2023-04-17 MED ORDER — LISINOPRIL-HYDROCHLOROTHIAZIDE 10-12.5 MG PO TABS: 1.0000 | ORAL_TABLET | Freq: Every day | ORAL | 0 refills | Status: DC

## 2023-04-17 NOTE — Progress Notes (Signed)
Established patient visit   Patient: Summer Shaffer   DOB: Jun 07, 1981   42 y.o. Female  MRN: [id number] Visit Date: 04/17/2023  Today's healthcare provider: Jacky Kindle, FNP  Introduced to nurse practitioner role and practice setting.  All questions answered.  Discussed provider/patient relationship and expectations.  Subjective    HPI HPI     Medical Management of Chronic Issues    Additional comments: Muscle spasms, muscles will not relax, spasms happening in the upper back, neck, bilateral scapulas, calves, has been getting worse in the past two weeks, prior treatment is trazadone, last taken last night, dont like taking       Last edited by Rolly Salter, CMA on 04/17/2023  2:18 PM.      The patient, with a history of Vitamin D deficiency, presents with multiple complaints. The chief complaint is tension headaches, which the patient describes as ongoing and associated with a sensation of tightness and burning in the forehead area. The patient also reports muscle tension, particularly in the back and calves, with the sensation that the muscles are going to rip. 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  The patient also reports sleep disturbances, stating that she does not sleep through the night. The patient does not find trazodone helpful, describing a feeling of fog and lack of clarity the morning after taking it.  The patient's blood pressure is noted to be high during the visit, and she reports that it was also [facility]. The patient attributes the elevated blood pressure to stress related to her job.  Medications: Outpatient Medications Prior to Visit  Medication Sig   Cholecalciferol (VITAMIN D3) 1000 units CAPS Take 1 capsule by mouth daily.   ferrous sulfate 325 (65 FE) MG EC tablet Take 325 mg by mouth 3 (three) times daily with meals.   fluticasone (FLONASE) 50  MCG/ACT nasal spray Place into both nostrils daily.   levonorgestrel (MIRENA) 20 MCG/24HR IUD 1 each by Intrauterine route once.   loratadine (CLARITIN) 10 MG tablet Take by mouth.   Multiple Vitamin (MULTIVITAMIN) tablet Take 1 tablet by mouth daily.   rizatriptan (MAXALT) 5 MG tablet TAKE 1 TABLET BY MOUTH AS NEEDED FOR MIGRAINE. MAY REPEAT IN 2 HOURS IF NEEDED   traZODone (DESYREL) 50 MG tablet Take 1 tablet (50 mg total) by mouth at bedtime.   cyclobenzaprine (FLEXERIL) 10 MG tablet Take 1 tablet (10 mg total) by mouth 3 (three) times daily as needed for muscle spasms. (Patient not taking: Reported on 04/17/2023)   Rimegepant Sulfate (NURTEC) 75 MG TBDP Take 75 mg by mouth daily as needed. (Patient not taking: Reported on 04/17/2023)   [DISCONTINUED] azithromycin (ZITHROMAX Z-PAK) 250 MG tablet Take 2 tablets (500 mg) today, then 1 tablet (250 mg) for next 4 days.   [DISCONTINUED] benzonatate (TESSALON) 100 MG capsule Take 1-2 tablets 3 times a day as needed for cough   No facility-administered medications prior to visit.     Objective    BP (!) 141/113 (BP Location: Left Arm, Patient Position: Sitting, Cuff Size: Large)   Pulse 88   Temp 98.1 F (36.7 C) (Oral)   Ht 5\' 4"  (1.626 m)   Wt 190 lb 11.2 oz (86.5 kg)   SpO2 98%   BMI 32.73 kg/m   Physical Exam Vitals and nursing note reviewed.  Constitutional:      General:  She is awake. She is not in acute distress.    Appearance: Normal appearance. She is well-developed and well-groomed. She is obese. She is not ill-appearing, toxic-appearing or diaphoretic.  HENT:     Head: Normocephalic and atraumatic.     Jaw: There is normal jaw occlusion. No trismus, tenderness, swelling or pain on movement.     Right Ear: Hearing, tympanic membrane, ear canal and external ear normal. There is no impacted cerumen.     Left Ear: Hearing, tympanic membrane, ear canal and external ear normal. There is no impacted cerumen.     Nose: Nose normal.  No congestion or rhinorrhea.     Right Turbinates: Not enlarged, swollen or pale.     Left Turbinates: Not enlarged, swollen or pale.     Right Sinus: No maxillary sinus tenderness or frontal sinus tenderness.     Left Sinus: No maxillary sinus tenderness or frontal sinus tenderness.     Mouth/Throat:     Lips: Pink.     Mouth: Mucous membranes are moist. No injury.     Tongue: No lesions.     Pharynx: Oropharynx is clear. Uvula midline. No pharyngeal swelling, oropharyngeal exudate, posterior oropharyngeal erythema or uvula swelling.     Tonsils: No tonsillar exudate or tonsillar abscesses.  Eyes:     General: Lids are normal. Lids are everted, no foreign bodies appreciated. Vision grossly intact. Gaze aligned appropriately. No allergic shiner or visual field deficit.       Right eye: No discharge.        Left eye: No discharge.     Extraocular Movements: Extraocular movements intact.     Conjunctiva/sclera: Conjunctivae normal.     Right eye: Right conjunctiva is not injected. No exudate.    Left eye: Left conjunctiva is not injected. No exudate.    Pupils: Pupils are equal, round, and reactive to light.  Neck:     Thyroid: No thyroid mass, thyromegaly or thyroid tenderness.     Vascular: No carotid bruit.     Trachea: Trachea normal.  Cardiovascular:     Rate and Rhythm: Normal rate and regular rhythm.     Pulses: Normal pulses.          Carotid pulses are 2+ on the right side and 2+ on the left side.      Radial pulses are 2+ on the right side and 2+ on the left side.       Dorsalis pedis pulses are 2+ on the right side and 2+ on the left side.       Posterior tibial pulses are 2+ on the right side and 2+ on the left side.     Heart sounds: Normal heart sounds, S1 normal and S2 normal. No murmur heard.    No friction rub. No gallop.  Pulmonary:     Effort: Pulmonary effort is normal. No respiratory distress.     Breath sounds: Normal breath sounds and air entry. No stridor. No  wheezing, rhonchi or rales.  Chest:     Chest wall: No tenderness.  Abdominal:     General: Abdomen is flat. Bowel sounds are normal. There is no distension.     Palpations: Abdomen is soft. There is no mass.     Tenderness: There is no abdominal tenderness. There is no right CVA tenderness, left CVA tenderness, guarding or rebound.     Hernia: No hernia is present.  Genitourinary:    Comments: Exam deferred; denies complaints Musculoskeletal:  General: No swelling, tenderness, deformity or signs of injury. Normal range of motion.     Cervical back: Full passive range of motion without pain, normal range of motion and neck supple. No edema, rigidity or tenderness. No muscular tenderness.     Right lower leg: No edema.     Left lower leg: No edema.  Lymphadenopathy:     Cervical: No cervical adenopathy.     Right cervical: No superficial, deep or posterior cervical adenopathy.    Left cervical: No superficial, deep or posterior cervical adenopathy.  Skin:    General: Skin is warm and dry.     Capillary Refill: Capillary refill takes less than 2 seconds.     Coloration: Skin is not jaundiced or pale.     Findings: No bruising, erythema, lesion or rash.  Neurological:     General: No focal deficit present.     Mental Status: She is alert and oriented to person, place, and time. Mental status is at baseline.     GCS: GCS eye subscore is 4. GCS verbal subscore is 5. GCS motor subscore is 6.     Sensory: Sensation is intact. No sensory deficit.     Motor: Motor function is intact. No weakness.     Coordination: Coordination is intact. Coordination normal.     Gait: Gait is intact. Gait normal.  Psychiatric:        Attention and Perception: Attention and perception normal.        Mood and Affect: Mood and affect normal.        Speech: Speech normal.        Behavior: Behavior normal. Behavior is cooperative.        Thought Content: Thought content normal.        Cognition and  Memory: Cognition and memory normal.        Judgment: Judgment normal.     No results found for any visits on 04/17/23.  Assessment & Plan     Problem List Items Addressed This Visit   None  Insomnia Trazodone causing morning fog and vivid dreams. Not achieving restful sleep. -Discontinue Trazodone.  Muscle Tension and Headaches Chronic muscle tension, particularly in the back and calves. Associated with headaches. Previous positive response to Flexeril. -Start Flexeril for bad headache days. -Recommend high dose Ibuprofen (800mg  every 8 hours for 3 days) to reduce inflammation, available OTC with meals/milk. -Consider Celebrex if covered by insurance. -Recommend stretching exercises and massage.  Stage 1 Hypertension Blood pressure readings consistently elevated. Possible stress-related component due to work. -Start low dose Lisinopril with a diuretic to manage blood pressure and reduce leg swelling.  Vitamin D Deficiency History of Vitamin D deficiency. Not currently taking supplements regularly. -Order labs to check current Vitamin D levels.  Prevention Care -UTD on mammogram -UTD on PAP; however, last PAP without HPV -Recommend routine labs  Follow-up in 1 month.  Leilani Merl, FNP, have reviewed all documentation for this visit. The documentation on 04/17/23 for the exam, diagnosis, procedures, and orders are all accurate and complete.  Jacky Kindle, FNP  [facility] [phone] (phone) [phone] (fax)  [facility]

## 2023-04-19 ENCOUNTER — Other Ambulatory Visit: Payer: Self-pay | Admitting: Family Medicine

## 2023-04-19 LAB — COMPREHENSIVE METABOLIC PANEL
ALT: 18 [IU]/L (ref 0–32)
AST: 15 [IU]/L (ref 0–40)
Albumin: 4 g/dL (ref 3.9–4.9)
Alkaline Phosphatase: 75 [IU]/L (ref 44–121)
BUN/Creatinine Ratio: 13 (ref 9–23)
BUN: 10 mg/dL (ref 6–24)
Bilirubin Total: 0.6 mg/dL (ref 0.0–1.2)
CO2: 26 mmol/L (ref 20–29)
Calcium: 9.4 mg/dL (ref 8.7–10.2)
Chloride: 102 mmol/L (ref 96–106)
Creatinine, Ser: 0.75 mg/dL (ref 0.57–1.00)
Globulin, Total: 3.5 g/dL (ref 1.5–4.5)
Glucose: 94 mg/dL (ref 70–99)
Potassium: 4.2 mmol/L (ref 3.5–5.2)
Sodium: 141 mmol/L (ref 134–144)
Total Protein: 7.5 g/dL (ref 6.0–8.5)
eGFR: 102 mL/min/{1.73_m2} (ref >59)

## 2023-04-19 LAB — LIPID PANEL
Chol/HDL Ratio: 3.7 {ratio} (ref 0.0–4.4)
Cholesterol, Total: 161 mg/dL (ref 100–199)
HDL: 43 mg/dL (ref >39)
LDL Chol Calc (NIH): 100 mg/dL — ABNORMAL HIGH (ref 0–99)
Triglycerides: 94 mg/dL (ref 0–149)
VLDL Cholesterol Cal: 18 mg/dL (ref 5–40)

## 2023-04-19 LAB — B12 AND FOLATE PANEL
Folate: 5.7 ng/mL (ref >3.0)
Vitamin B-12: 332 pg/mL (ref 232–1245)

## 2023-04-19 LAB — CBC WITH DIFFERENTIAL/PLATELET
Basophils Absolute: 0.1 10*3/uL (ref 0.0–0.2)
Basos: 1 %
EOS (ABSOLUTE): 0.6 10*3/uL — ABNORMAL HIGH (ref 0.0–0.4)
Eos: 8 %
Hematocrit: 41.4 % (ref 34.0–46.6)
Hemoglobin: 13.6 g/dL (ref 11.1–15.9)
Immature Grans (Abs): 0 10*3/uL (ref 0.0–0.1)
Immature Granulocytes: 0 %
Lymphocytes Absolute: 2.4 10*3/uL (ref 0.7–3.1)
Lymphs: 33 %
MCH: 29.6 pg (ref 26.6–33.0)
MCHC: 32.9 g/dL (ref 31.5–35.7)
MCV: 90 fL (ref 79–97)
Monocytes Absolute: 0.6 10*3/uL (ref 0.1–0.9)
Monocytes: 8 %
Neutrophils Absolute: 3.6 10*3/uL (ref 1.4–7.0)
Neutrophils: 50 %
Platelets: 351 10*3/uL (ref 150–450)
RBC: 4.59 x10E6/uL (ref 3.77–5.28)
RDW: 11.5 % — ABNORMAL LOW (ref 11.7–15.4)
WBC: 7.2 10*3/uL (ref 3.4–10.8)

## 2023-04-19 LAB — TSH: TSH: 2.05 u[IU]/mL (ref 0.450–4.500)

## 2023-04-19 LAB — HEMOGLOBIN A1C
Est. average glucose Bld gHb Est-mCnc: 105 mg/dL
Hgb A1c MFr Bld: 5.3 % (ref 4.8–5.6)

## 2023-04-19 LAB — VITAMIN D 25 HYDROXY (VIT D DEFICIENCY, FRACTURES): Vit D, 25-Hydroxy: 32.5 ng/mL (ref 30.0–100.0)

## 2023-04-19 MED ORDER — CYANOCOBALAMIN 1000 MCG/ML IJ SOLN: 1000.0000 ug | INTRAMUSCULAR | 0 refills | Status: DC

## 2023-04-19 MED ORDER — VITAMIN D (ERGOCALCIFEROL) 1.25 MG (50000 UNIT) PO CAPS: 50000.0000 [IU] | ORAL_CAPSULE | ORAL | 0 refills | Status: DC

## 2023-04-19 NOTE — Progress Notes (Signed)
Recommend Vit D and B-12 supplementation; please let us know if you feel comfortable with self injection of B-12 following teaching or if you wish to be referred to hematology to assist. Take the vit D once weekly

## 2023-04-28 ENCOUNTER — Ambulatory Visit: Payer: BLUE CROSS/BLUE SHIELD | Admitting: Family Medicine

## 2023-04-28 ENCOUNTER — Encounter: Payer: Self-pay | Admitting: Family Medicine

## 2023-04-28 VITALS — BP 119/81 | HR 83 | Resp 16 | Ht 64.0 in | Wt 187.7 lb

## 2023-04-28 DIAGNOSIS — F430 Acute stress reaction: Secondary | ICD-10-CM | POA: Diagnosis not present

## 2023-04-28 DIAGNOSIS — E538 Deficiency of other specified B group vitamins: Secondary | ICD-10-CM

## 2023-04-28 DIAGNOSIS — I10 Essential (primary) hypertension: Secondary | ICD-10-CM

## 2023-04-28 DIAGNOSIS — F410 Panic disorder [episodic paroxysmal anxiety] without agoraphobia: Secondary | ICD-10-CM | POA: Diagnosis not present

## 2023-04-28 MED ORDER — LORAZEPAM 0.5 MG PO TABS: 0.5000 mg | ORAL_TABLET | Freq: Two times a day (BID) | ORAL | 1 refills | Status: DC | PRN

## 2023-04-28 NOTE — Progress Notes (Signed)
Established patient visit   Patient: Summer Shaffer   DOB: 01-10-1981   42 y.o. Female  MRN: [id number] Visit Date: 04/28/2023  Today's healthcare provider: Jacky Kindle, FNP  Introduced to nurse practitioner role and practice setting.  All questions answered.  Discussed provider/patient relationship and expectations.  Subjective    HPI HPI     Stress    Additional comments: Thinks she had a panic attack, unsure if it was from one of the new medications or a deficiency. Thinks she needs a b12 shot.       Last edited by Jama Flavors, CMA on 04/28/2023  2:47 PM.      The patient, with a history of anxiety, presents after experiencing a panic attack over the weekend. The episode was triggered by a text message from a doctor regarding a scheduling issue. The patient describes the panic attack as overwhelming, with symptoms including numbness in the fingers and toes, and difficulty catching their breath. The episode lasted approximately two hours, during which the patient was unable to function. The patient managed the situation by writing down their thoughts and handing off the issue to their supervisor. The patient acknowledges a pattern of feeling pressured to respond immediately to certain people, fearing negative consequences if they do not. They also express feeling stretched thin and tired.  In addition to the anxiety, the patient has low B12 levels. They express willingness to start self-administered B12 injections, despite never having given themselves a shot before. The patient is also on medication for blood pressure, which appears to be well-controlled.  Medications: Outpatient Medications Prior to Visit  Medication Sig   celecoxib (CELEBREX) 200 MG capsule Take 1 capsule (200 mg total) by mouth 2 (two) times daily.   Cholecalciferol (VITAMIN D3) 1000 units CAPS Take 1 capsule by mouth daily.   cyanocobalamin (VITAMIN B12) 1000 MCG/ML injection Inject 1 mL (1,000 mcg  total) into the muscle once a week.   cyclobenzaprine (FLEXERIL) 10 MG tablet Take 1 tablet (10 mg total) by mouth at bedtime.   ferrous sulfate 325 (65 FE) MG EC tablet Take 325 mg by mouth 3 (three) times daily with meals.   fluticasone (FLONASE) 50 MCG/ACT nasal spray Place into both nostrils daily.   levonorgestrel (MIRENA) 20 MCG/24HR IUD 1 each by Intrauterine route once.   lisinopril-hydrochlorothiazide (ZESTORETIC) 10-12.5 MG tablet Take 1 tablet by mouth daily.   loratadine (CLARITIN) 10 MG tablet Take by mouth.   Multiple Vitamin (MULTIVITAMIN) tablet Take 1 tablet by mouth daily.   Rimegepant Sulfate (NURTEC) 75 MG TBDP Take 75 mg by mouth daily as needed.   rizatriptan (MAXALT) 5 MG tablet TAKE 1 TABLET BY MOUTH AS NEEDED FOR MIGRAINE. MAY REPEAT IN 2 HOURS IF NEEDED   traZODone (DESYREL) 50 MG tablet Take 1 tablet (50 mg total) by mouth at bedtime.   Vitamin D, Ergocalciferol, (DRISDOL) 1.25 MG (50000 UNIT) CAPS capsule Take 1 capsule (50,000 Units total) by mouth every 7 (seven) days.   No facility-administered medications prior to visit.     Objective    BP 119/81   Pulse 83   Resp 16   Ht 5\' 4"  (1.626 m)   Wt 187 lb 11.2 oz (85.1 kg)   SpO2 98%   BMI 32.22 kg/m   Physical Exam Vitals and nursing note reviewed.  Constitutional:      General: She is not in acute distress.    Appearance: Normal appearance. She is obese. She  is not ill-appearing, toxic-appearing or diaphoretic.  HENT:     Head: Normocephalic and atraumatic.  Cardiovascular:     Rate and Rhythm: Normal rate and regular rhythm.     Pulses: Normal pulses.     Heart sounds: Normal heart sounds. No murmur heard.    No friction rub. No gallop.  Pulmonary:     Effort: Pulmonary effort is normal. No respiratory distress.     Breath sounds: Normal breath sounds. No stridor. No wheezing, rhonchi or rales.  Chest:     Chest wall: No tenderness.  Musculoskeletal:        General: No swelling, tenderness,  deformity or signs of injury. Normal range of motion.     Right lower leg: No edema.     Left lower leg: No edema.  Skin:    General: Skin is warm and dry.     Capillary Refill: Capillary refill takes less than 2 seconds.     Coloration: Skin is not jaundiced or pale.     Findings: No bruising, erythema, lesion or rash.  Neurological:     General: No focal deficit present.     Mental Status: She is alert and oriented to person, place, and time. Mental status is at baseline.     Cranial Nerves: No cranial nerve deficit.     Sensory: No sensory deficit.     Motor: No weakness.     Coordination: Coordination normal.  Psychiatric:        Mood and Affect: Mood is anxious.        Behavior: Behavior normal.        Thought Content: Thought content normal. Thought content does not include homicidal or suicidal ideation. Thought content does not include homicidal or suicidal plan.        Judgment: Judgment normal.     No results found for any visits on 04/28/23.  Assessment & Plan     Problem List Items Addressed This Visit   None  Panic Attack Experienced a panic attack on Saturday morning triggered by a stressful text message. Symptoms included numbness in fingers and toes, difficulty breathing, and inability to function for approximately two hours. -Prescribe Ativan 0.5mg  as needed for panic attacks. -Encourage continued use of coping mechanisms such as writing down thoughts during a panic attack.  Vitamin B12 Deficiency Discussed the need for B12 injections due to low levels. Patient expressed willingness to self-administer injections. -Prescribe B12 injections, 1ml to be administered subcutaneously once a week initially, then once a month after achieving normal levels. -Provide education on self-administration of injections.  Hypertension Blood pressure improved. -Continue current management plan.  Follow-up Monitor response to B12 injections and Ativan. Assess mood changes and  overall mental health. -Schedule follow-up appointment in 30 days to check B12 levels and complete a regular cell count. -Encourage patient to reach out if they experience any issues or have concerns.    Leilani Merl, FNP, have reviewed all documentation for this visit. The documentation on 04/28/23 for the exam, diagnosis, procedures, and orders are all accurate and complete.  Jacky Kindle, FNP  [facility] [phone] (phone) [phone] (fax)  [facility]

## 2023-05-08 DIAGNOSIS — E538 Deficiency of other specified B group vitamins: Secondary | ICD-10-CM | POA: Insufficient documentation

## 2023-05-08 DIAGNOSIS — F410 Panic disorder [episodic paroxysmal anxiety] without agoraphobia: Secondary | ICD-10-CM | POA: Insufficient documentation

## 2023-05-09 ENCOUNTER — Other Ambulatory Visit: Payer: Self-pay | Admitting: Family Medicine

## 2023-05-09 DIAGNOSIS — I10 Essential (primary) hypertension: Secondary | ICD-10-CM

## 2023-05-09 NOTE — Telephone Encounter (Signed)
Requested Prescriptions  Pending Prescriptions Disp Refills   lisinopril-hydrochlorothiazide (ZESTORETIC) 10-12.5 MG tablet [Pharmacy Med Name: LISINOPRIL-HCTZ 10-12.5 MG TAB] 90 tablet 1    Sig: TAKE 1 TABLET BY MOUTH EVERY DAY     Cardiovascular:  ACEI + Diuretic Combos Passed - 05/09/2023  9:31 AM      Passed - Na in normal range and within 180 days    Sodium  Date Value Ref Range Status  04/18/2023 141 134 - 144 mmol/L Final         Passed - K in normal range and within 180 days    Potassium  Date Value Ref Range Status  04/18/2023 4.2 3.5 - 5.2 mmol/L Final         Passed - Cr in normal range and within 180 days    Creatinine, Ser  Date Value Ref Range Status  04/18/2023 0.75 0.57 - 1.00 mg/dL Final         Passed - eGFR is 30 or above and within 180 days    GFR, Estimated  Date Value Ref Range Status  02/22/2021 >60 >60 mL/min Final    Comment:    (NOTE) Calculated using the CKD-EPI Creatinine Equation (2021)    GFR  Date Value Ref Range Status  05/11/2019 101.74 >60.00 mL/min Final   eGFR  Date Value Ref Range Status  04/18/2023 102 >59 mL/min/1.73 Final         Passed - Patient is not pregnant      Passed - Last BP in normal range    BP Readings from Last 1 Encounters:  04/28/23 119/81         Passed - Valid encounter within last 6 months    Recent Outpatient Visits           1 week ago Panic attack as reaction to stress   [facility] Jacky Kindle, FNP   3 weeks ago Annual physical exam   [facility] Merita Norton T, FNP   11 months ago Other migraine with status migrainosus, intractable   [place] [facility] Jacky Kindle, FNP   1 year ago Annual physical exam   [facility] Jacky Kindle, FNP   2 years ago No-show for appointment   [facility], Marzella Schlein, MD

## 2023-05-16 ENCOUNTER — Ambulatory Visit: Payer: BLUE CROSS/BLUE SHIELD | Admitting: Family Medicine

## 2023-05-18 ENCOUNTER — Other Ambulatory Visit: Payer: Self-pay | Admitting: Family Medicine

## 2023-05-18 DIAGNOSIS — G5682 Other specified mononeuropathies of left upper limb: Secondary | ICD-10-CM

## 2023-05-18 DIAGNOSIS — G5681 Other specified mononeuropathies of right upper limb: Secondary | ICD-10-CM

## 2023-05-19 NOTE — Telephone Encounter (Signed)
Requested Prescriptions  Pending Prescriptions Disp Refills   celecoxib (CELEBREX) 200 MG capsule [Pharmacy Med Name: CELECOXIB 200 MG CAPSULE] 60 capsule 2    Sig: TAKE 1 CAPSULE BY MOUTH TWICE A DAY     Analgesics:  COX2 Inhibitors Failed - 05/18/2023 12:30 PM      Failed - Manual Review: Labs are only required if the patient has taken medication for more than 8 weeks.      Passed - HGB in normal range and within 360 days    Hemoglobin  Date Value Ref Range Status  04/18/2023 13.6 11.1 - 15.9 g/dL Final         Passed - Cr in normal range and within 360 days    Creatinine, Ser  Date Value Ref Range Status  04/18/2023 0.75 0.57 - 1.00 mg/dL Final         Passed - HCT in normal range and within 360 days    Hematocrit  Date Value Ref Range Status  04/18/2023 41.4 34.0 - 46.6 % Final         Passed - AST in normal range and within 360 days    AST  Date Value Ref Range Status  04/18/2023 15 0 - 40 IU/L Final         Passed - ALT in normal range and within 360 days    ALT  Date Value Ref Range Status  04/18/2023 18 0 - 32 IU/L Final         Passed - eGFR is 30 or above and within 360 days    GFR, Estimated  Date Value Ref Range Status  02/22/2021 >60 >60 mL/min Final    Comment:    (NOTE) Calculated using the CKD-EPI Creatinine Equation (2021)    GFR  Date Value Ref Range Status  05/11/2019 101.74 >60.00 mL/min Final   eGFR  Date Value Ref Range Status  04/18/2023 102 >59 mL/min/1.73 Final         Passed - Patient is not pregnant      Passed - Valid encounter within last 12 months    Recent Outpatient Visits           3 weeks ago Panic attack as reaction to stress   [facility] Jacky Kindle, FNP   1 month ago Annual physical exam   [facility] Merita Norton T, FNP   11 months ago Other migraine with status migrainosus, intractable   [place] [facility] Merita Norton T, FNP   2  years ago Annual physical exam   [facility] Jacky Kindle, FNP   2 years ago No-show for appointment   [facility], Marzella Schlein, MD

## 2023-06-10 ENCOUNTER — Ambulatory Visit (INDEPENDENT_AMBULATORY_CARE_PROVIDER_SITE_OTHER): Payer: BLUE CROSS/BLUE SHIELD | Admitting: Family Medicine

## 2023-06-10 ENCOUNTER — Encounter: Payer: Self-pay | Admitting: Family Medicine

## 2023-06-10 VITALS — BP 124/85 | HR 87 | Ht 64.0 in | Wt 189.0 lb

## 2023-06-10 DIAGNOSIS — R29818 Other symptoms and signs involving the nervous system: Secondary | ICD-10-CM | POA: Insufficient documentation

## 2023-06-10 DIAGNOSIS — F410 Panic disorder [episodic paroxysmal anxiety] without agoraphobia: Secondary | ICD-10-CM | POA: Diagnosis not present

## 2023-06-10 DIAGNOSIS — F411 Generalized anxiety disorder: Secondary | ICD-10-CM

## 2023-06-10 MED ORDER — LORAZEPAM 0.5 MG PO TABS: 0.5000 mg | ORAL_TABLET | Freq: Two times a day (BID) | ORAL | 2 refills | Status: DC | PRN

## 2023-06-10 MED ORDER — FLUCONAZOLE 150 MG PO TABS: ORAL_TABLET | ORAL | 0 refills | Status: DC

## 2023-06-10 MED ORDER — BUPROPION HCL ER (XL) 150 MG PO TB24: 150.0000 mg | ORAL_TABLET | Freq: Every day | ORAL | 0 refills | Status: DC

## 2023-06-10 NOTE — Progress Notes (Signed)
Established patient visit  Patient: Summer Shaffer   DOB: 1981-02-13   42 y.o. Female  MRN: [id number] Visit Date: 06/10/2023  Today's healthcare provider: Jacky Kindle, FNP  Introduced to nurse practitioner role and practice setting.  All questions answered.  Discussed provider/patient relationship and expectations.  Chief Complaint  Patient presents with   Follow-up    anxiety   Subjective    HPI HPI     Follow-up    Additional comments: anxiety      Last edited by Shelly Bombard, CMA on 06/10/2023 10:28 AM.      Medications: Outpatient Medications Prior to Visit  Medication Sig   celecoxib (CELEBREX) 200 MG capsule TAKE 1 CAPSULE BY MOUTH TWICE A DAY   cyanocobalamin (VITAMIN B12) 1000 MCG/ML injection Inject 1 mL (1,000 mcg total) into the muscle once a week.   levonorgestrel (MIRENA) 20 MCG/24HR IUD 1 each by Intrauterine route once.   lisinopril-hydrochlorothiazide (ZESTORETIC) 10-12.5 MG tablet TAKE 1 TABLET BY MOUTH EVERY DAY   Vitamin D, Ergocalciferol, (DRISDOL) 1.25 MG (50000 UNIT) CAPS capsule Take 1 capsule (50,000 Units total) by mouth every 7 (seven) days.   [DISCONTINUED] Cholecalciferol (VITAMIN D3) 1000 units CAPS Take 1 capsule by mouth daily.   [DISCONTINUED] cyclobenzaprine (FLEXERIL) 10 MG tablet Take 1 tablet (10 mg total) by mouth at bedtime.   [DISCONTINUED] ferrous sulfate 325 (65 FE) MG EC tablet Take 325 mg by mouth 3 (three) times daily with meals.   [DISCONTINUED] fluticasone (FLONASE) 50 MCG/ACT nasal spray Place into both nostrils daily.   [DISCONTINUED] loratadine (CLARITIN) 10 MG tablet Take by mouth.   [DISCONTINUED] LORazepam (ATIVAN) 0.5 MG tablet Take 1 tablet (0.5 mg total) by mouth 2 (two) times daily as needed for anxiety.   [DISCONTINUED] Multiple Vitamin (MULTIVITAMIN) tablet Take 1 tablet by mouth daily.   [DISCONTINUED] Rimegepant Sulfate (NURTEC) 75 MG TBDP Take 75 mg by mouth daily as needed.   [DISCONTINUED] rizatriptan  (MAXALT) 5 MG tablet TAKE 1 TABLET BY MOUTH AS NEEDED FOR MIGRAINE. MAY REPEAT IN 2 HOURS IF NEEDED   [DISCONTINUED] traZODone (DESYREL) 50 MG tablet Take 1 tablet (50 mg total) by mouth at bedtime. (Patient not taking: Reported on 06/10/2023)   No facility-administered medications prior to visit.   Last CBC Lab Results  Component Value Date   WBC 7.2 04/18/2023   HGB 13.6 04/18/2023   HCT 41.4 04/18/2023   MCV 90 04/18/2023   MCH 29.6 04/18/2023   RDW 11.5 (L) 04/18/2023   PLT 351 04/18/2023   Last metabolic panel Lab Results  Component Value Date   GLUCOSE 94 04/18/2023   NA 141 04/18/2023   K 4.2 04/18/2023   CL 102 04/18/2023   CO2 26 04/18/2023   BUN 10 04/18/2023   CREATININE 0.75 04/18/2023   EGFR 102 04/18/2023   CALCIUM 9.4 04/18/2023   PROT 7.5 04/18/2023   ALBUMIN 4.0 04/18/2023   LABGLOB 3.5 04/18/2023   BILITOT 0.6 04/18/2023   ALKPHOS 75 04/18/2023   AST 15 04/18/2023   ALT 18 04/18/2023   ANIONGAP 6 02/22/2021   Last lipids Lab Results  Component Value Date   CHOL 161 04/18/2023   HDL 43 04/18/2023   LDLCALC 100 (H) 04/18/2023   TRIG 94 04/18/2023   CHOLHDL 3.7 04/18/2023   Last hemoglobin A1c Lab Results  Component Value Date   HGBA1C 5.3 04/18/2023   Last thyroid functions Lab Results  Component Value Date  TSH 2.050 04/18/2023   Last vitamin D Lab Results  Component Value Date   VD25OH 32.5 04/18/2023   Last vitamin B12 and Folate Lab Results  Component Value Date   VITAMINB12 332 04/18/2023   FOLATE 5.7 04/18/2023     Objective    BP 124/85 (BP Location: Left Arm, Patient Position: Sitting, Cuff Size: Normal)   Pulse 87   Ht 5\' 4"  (1.626 m)   Wt 189 lb (85.7 kg)   SpO2 97%   BMI 32.44 kg/m   BP Readings from Last 3 Encounters:  06/10/23 124/85  04/28/23 119/81  04/17/23 132/89   Wt Readings from Last 3 Encounters:  06/10/23 189 lb (85.7 kg)  04/28/23 187 lb 11.2 oz (85.1 kg)  04/17/23 190 lb 11.2 oz (86.5 kg)    SpO2 Readings from Last 3 Encounters:  06/10/23 97%  04/28/23 98%  04/17/23 98%   Physical Exam Vitals and nursing note reviewed.  Constitutional:      General: She is not in acute distress.    Appearance: Normal appearance. She is obese. She is not ill-appearing, toxic-appearing or diaphoretic.  HENT:     Head: Normocephalic and atraumatic.  Cardiovascular:     Rate and Rhythm: Normal rate and regular rhythm.     Pulses: Normal pulses.     Heart sounds: Normal heart sounds. No murmur heard.    No friction rub. No gallop.  Pulmonary:     Effort: Pulmonary effort is normal. No respiratory distress.     Breath sounds: Normal breath sounds. No stridor. No wheezing, rhonchi or rales.  Chest:     Chest wall: No tenderness.  Abdominal:     Palpations: Abdomen is soft.  Musculoskeletal:        General: No swelling, tenderness, deformity or signs of injury. Normal range of motion.     Right lower leg: No edema.     Left lower leg: No edema.  Skin:    General: Skin is warm and dry.     Capillary Refill: Capillary refill takes less than 2 seconds.     Coloration: Skin is not jaundiced or pale.     Findings: No bruising, erythema, lesion or rash.  Neurological:     General: No focal deficit present.     Mental Status: She is alert and oriented to person, place, and time. Mental status is at baseline.     Cranial Nerves: No cranial nerve deficit.     Sensory: No sensory deficit.     Motor: No weakness.     Coordination: Coordination normal.  Psychiatric:        Mood and Affect: Mood normal.        Behavior: Behavior normal.        Thought Content: Thought content normal.        Judgment: Judgment normal.     No results found for any visits on 06/10/23.  Assessment & Plan     Problem List Items Addressed This Visit       Other   Generalized anxiety disorder with panic attacks - Primary    Chronic improved; however, wishes to work to lower baseline level of  anxiety Discussed use of SSRI vs adjunctive agents Pt notes anxiety is worse with sensory issues and reports 'an already long fuse' but feels overwhelmed with excess noise or items out of her control Trial of wellbutrin 150 mg xr daily; advised of possible side effects and concern for worsening of symptoms  6 week trial provided with follow up Will continue low dose xanax at BID PRN dosing given flares of anxiety in hopes of further symptom management      Relevant Medications   LORazepam (ATIVAN) 0.5 MG tablet   buPROPion (WELLBUTRIN XL) 150 MG 24 hr tablet   Sensory overload    Chronic improved; however, wishes to work to lower baseline level of anxiety Discussed use of SSRI vs adjunctive agents Pt notes anxiety is worse with sensory issues and reports 'an already long fuse' but feels overwhelmed with excess noise or items out of her control Trial of wellbutrin 150 mg xr daily; advised of possible side effects and concern for worsening of symptoms 6 week trial provided with follow up Will continue low dose xanax at BID PRN dosing given flares of anxiety in hopes of further symptom management         06/10/2023   10:28 AM 04/28/2023    2:47 PM 04/17/2023    2:24 PM  PHQ9 SCORE ONLY  PHQ-9 Total Score 3 8 6       06/10/2023   10:28 AM 04/28/2023    2:47 PM 04/17/2023    2:23 PM 05/11/2019    8:13 AM  GAD 7 : Generalized Anxiety Score  Nervous, Anxious, on Edge 2 2 1 1   Control/stop worrying 0 0 1   Worry too much - different things 0 0 1   Trouble relaxing 2 3 3    Restless 0 0 0   Easily annoyed or irritable 2 1 1    Afraid - awful might happen 0 0 0   Total GAD 7 Score 6 6 7    Anxiety Difficulty Not difficult at all Somewhat difficult     Return in about 6 weeks (around 07/22/2023), or if symptoms worsen or fail to improve, for anxiety and depression.     Leilani Merl, FNP, have reviewed all documentation for this visit. The documentation on 06/10/23 for the exam,  diagnosis, procedures, and orders are all accurate and complete.  Jacky Kindle, FNP  [facility] [phone] (phone) [phone] (fax)  [facility]

## 2023-06-10 NOTE — Assessment & Plan Note (Signed)
Chronic improved; however, wishes to work to lower baseline level of anxiety Discussed use of SSRI vs adjunctive agents Pt notes anxiety is worse with sensory issues and reports 'an already long fuse' but feels overwhelmed with excess noise or items out of her control Trial of wellbutrin 150 mg xr daily; advised of possible side effects and concern for worsening of symptoms 6 week trial provided with follow up Will continue low dose xanax at BID PRN dosing given flares of anxiety in hopes of further symptom management

## 2023-06-29 ENCOUNTER — Other Ambulatory Visit: Payer: Self-pay | Admitting: Family Medicine

## 2023-06-30 MED ORDER — CYANOCOBALAMIN 1000 MCG/ML IJ SOLN: 1000.0000 ug | INTRAMUSCULAR | 0 refills | Status: DC

## 2023-06-30 MED ORDER — VITAMIN D (ERGOCALCIFEROL) 1.25 MG (50000 UNIT) PO CAPS: 50000.0000 [IU] | ORAL_CAPSULE | ORAL | 0 refills | Status: DC

## 2023-09-08 ENCOUNTER — Telehealth: Payer: Self-pay | Admitting: Family Medicine

## 2023-09-08 MED ORDER — BUPROPION HCL ER (XL) 150 MG PO TB24: 150.0000 mg | ORAL_TABLET | Freq: Every day | ORAL | 0 refills | Status: DC

## 2023-09-08 NOTE — Telephone Encounter (Signed)
 [facility] faxed refill request for the following medications:   buPROPion (WELLBUTRIN XL) 150 MG 24 hr tablet    Please advise.

## 2023-09-10 ENCOUNTER — Other Ambulatory Visit: Payer: Self-pay | Admitting: Family Medicine

## 2023-09-10 NOTE — Telephone Encounter (Signed)
 Requested Prescriptions  Pending Prescriptions Disp Refills   cyanocobalamin (VITAMIN B12) 1000 MCG/ML injection [Pharmacy Med Name: CYANOCOBALAMIN 1,000 MCG/ML VL] 10 mL 0    Sig: INJECT 1 ML (1,000 MCG TOTAL) INTO THE MUSCLE ONCE A WEEK.     Endocrinology:  Vitamins - Vitamin B12 Passed - 09/10/2023  1:54 PM      Passed - HCT in normal range and within 360 days    Hematocrit  Date Value Ref Range Status  04/18/2023 41.4 34.0 - 46.6 % Final         Passed - HGB in normal range and within 360 days    Hemoglobin  Date Value Ref Range Status  04/18/2023 13.6 11.1 - 15.9 g/dL Final         Passed - B12 Level in normal range and within 360 days    Vitamin B-12  Date Value Ref Range Status  04/18/2023 332 232 - 1,245 pg/mL Final         Passed - Valid encounter within last 12 months    Recent Outpatient Visits           3 months ago Generalized anxiety disorder with panic attacks   [facility] Merita Norton T, FNP   4 months ago Panic attack as reaction to stress   [facility] Jacky Kindle, FNP   4 months ago Annual physical exam   [facility] Merita Norton T, FNP   1 year ago Other migraine with status migrainosus, intractable   [facility] Jacky Kindle, FNP   2 years ago Annual physical exam   [facility] Jacky Kindle, FNP

## 2023-10-23 ENCOUNTER — Encounter: Payer: Self-pay | Admitting: Family Medicine

## 2023-11-17 ENCOUNTER — Other Ambulatory Visit: Payer: Self-pay | Admitting: Family Medicine

## 2023-11-25 ENCOUNTER — Other Ambulatory Visit: Payer: Self-pay

## 2023-11-25 ENCOUNTER — Telehealth: Payer: Self-pay | Admitting: Family Medicine

## 2023-11-25 DIAGNOSIS — I10 Essential (primary) hypertension: Secondary | ICD-10-CM

## 2023-11-25 MED ORDER — LISINOPRIL-HYDROCHLOROTHIAZIDE 10-12.5 MG PO TABS: 1.0000 | ORAL_TABLET | Freq: Every day | ORAL | 1 refills | Status: DC

## 2023-11-25 NOTE — Telephone Encounter (Signed)
[facility] faxed refill request for the following medications:  lisinopril-hydrochlorothiazide (ZESTORETIC) 10-12.5 MG tablet   Please advise.  

## 2023-11-25 NOTE — Telephone Encounter (Signed)
Converted to rf req

## 2023-12-04 ENCOUNTER — Encounter: Payer: Self-pay | Admitting: Emergency Medicine

## 2023-12-04 ENCOUNTER — Emergency Department
Admission: EM | Admit: 2023-12-04 | Discharge: 2023-12-04 | Disposition: A | Discharge status: Home / Self Care | Attending: Emergency Medicine | Admitting: Emergency Medicine

## 2023-12-04 ENCOUNTER — Other Ambulatory Visit: Payer: Self-pay

## 2023-12-04 ENCOUNTER — Emergency Department

## 2023-12-04 DIAGNOSIS — M79604 Pain in right leg: Secondary | ICD-10-CM | POA: Insufficient documentation

## 2023-12-04 DIAGNOSIS — M545 Low back pain, unspecified: Secondary | ICD-10-CM | POA: Insufficient documentation

## 2023-12-04 DIAGNOSIS — I10 Essential (primary) hypertension: Secondary | ICD-10-CM | POA: Insufficient documentation

## 2023-12-04 LAB — COMPREHENSIVE METABOLIC PANEL WITH GFR
ALT: 16 U/L (ref 0–44)
AST: 17 U/L (ref 15–41)
Albumin: 4 g/dL (ref 3.5–5.0)
Alkaline Phosphatase: 58 U/L (ref 38–126)
Anion gap: 9 (ref 5–15)
BUN: 12 mg/dL (ref 6–20)
CO2: 23 mmol/L (ref 22–32)
Calcium: 8.9 mg/dL (ref 8.9–10.3)
Chloride: 104 mmol/L (ref 98–111)
Creatinine, Ser: 0.73 mg/dL (ref 0.44–1.00)
GFR, Estimated: >60 mL/min (ref >60)
Glucose, Bld: 90 mg/dL (ref 70–99)
Potassium: 3.7 mmol/L (ref 3.5–5.1)
Sodium: 136 mmol/L (ref 135–145)
Total Bilirubin: 0.7 mg/dL (ref 0.0–1.2)
Total Protein: 8.5 g/dL — ABNORMAL HIGH (ref 6.5–8.1)

## 2023-12-04 LAB — PROTIME-INR
INR: 0.9 (ref 0.8–1.2)
Prothrombin Time: 12.5 s (ref 11.4–15.2)

## 2023-12-04 MED ORDER — CYCLOBENZAPRINE HCL 10 MG PO TABS: 10.0000 mg | ORAL_TABLET | Freq: Three times a day (TID) | ORAL | 0 refills | Status: AC | PRN

## 2023-12-04 MED ORDER — MELOXICAM 15 MG PO TABS: 15.0000 mg | ORAL_TABLET | Freq: Every day | ORAL | 0 refills | Status: AC

## 2023-12-04 NOTE — ED Notes (Signed)
 See triage note. Pt states Sx began at approx midnight and has been intermittent. Pt has good distal pedal pulses, Cap refill <3 sec. Denies numbness/tingling. Pt with weakened flexion and extention of right foot due to pain. Pt reports that compression of right calf actually relieved her symptoms. Pt A&Ox4, NAD, ambulatory at time of assessment.

## 2023-12-04 NOTE — ED Provider Notes (Signed)
 [facility] Provider Note    Event Date/Time   First MD Initiated Contact with Patient 12/04/23 1843     (approximate)   History   Leg Pain    HPI  Summer Shaffer is a 43 y.o. female    with a past medical history of GAD, panic attack, atypical chest pain, migraine, hypertension, with no significant past medical history who presents to the ED complaining of right lower pain. According to the patient , she woke up this morning with right anterior lower pain, pain is located in the anterior area of the right leg, is not radiating pain coming from the back.  Patient states it is tender to palpation.  Patient states she was traveling this week, driving to hours every day to go to work.  Patient denies wheezing, chest pain, shortness of breath.  No history of trauma.  Patient is not taking any hormones, no smoking.  This is her first episode.  Patient took ibuprofen 800 mg without improvement.       Physical Exam   Triage Vital Signs: ED Triage Vitals  Encounter Vitals Group     BP 12/04/23 1818 (!) 133/117     Systolic BP Percentile --      Diastolic BP Percentile --      Pulse Rate 12/04/23 1818 82     Resp 12/04/23 1818 17     Temp 12/04/23 1818 98.4 F (36.9 C)     Temp Source 12/04/23 1818 Oral     SpO2 12/04/23 1818 100 %     Weight 12/04/23 1817 184 lb (83.5 kg)     Height 12/04/23 1817 5\' 4"  (1.626 m)     Head Circumference --      Peak Flow --      Pain Score 12/04/23 1817 7     Pain Loc --      Pain Education --      Exclude from Growth Chart --     Most recent vital signs: Vitals:   12/04/23 1818  BP: (!) 133/117  Pulse: 82  Resp: 17  Temp: 98.4 F (36.9 C)  SpO2: 100%     Constitutional: Alert, NAD. Able to speak in complete sentences without cough or dyspnea Eyes: Conjunctivae are normal.  Head: Atraumatic. Nose: No congestion/rhinnorhea. Mouth/Throat: Mucous membranes are moist.   Neck: Painless ROM. Supple. No JVD,  nodes, thyromegaly  Cardiovascular:   Good peripheral circulation.RRR no murmurs, gallops, rubs  Respiratory: Normal respiratory effort.  No retractions. Clear to auscultation bilaterally without wheezing or crackles  Gastrointestinal: Soft and nontender.  Musculoskeletal:  no deformity Right lower extremity: Skin is intact, no tenderness to palpation in the calf, tender to palpation in the third medial area of the anterior tibial area.  No changes in color of the skin, no erythema, no warmth, no signs of infection.  Pulses are positive, full ROM.  Neurologic:  MAE spontaneously. No gross focal neurologic deficits are appreciated.  Skin:  Skin is warm, dry and intact. No rash noted. Psychiatric: Mood and affect are normal. Speech and behavior are normal.    ED Results / Procedures / Treatments   Labs (all labs ordered are listed, but only abnormal results are displayed) Labs Reviewed  COMPREHENSIVE METABOLIC PANEL WITH GFR - Abnormal; Notable for the following components:      Result Value   Total Protein 8.5 (*)    All other components within normal limits  PROTIME-INR  CBC WITH  DIFFERENTIAL/PLATELET     EKG See physician read    RADIOLOGY I independently reviewed and interpreted imaging and agree with radiologists findings.     PROCEDURES:  Critical Care performed:   Procedures   MEDICATIONS ORDERED IN ED: Medications - No data to display Clinical Course as of 12/04/23 2110  Thu Dec 04, 2023  2057 US  Venous Img Lower Right (DVT Study) . No evidence of DVT [AE]  2101 Reassessed the patient, updated patient with results of the Doppler.  Patient requests referral to orthopedics for back pain.  Patient is ready to discharge with NSAIDs and muscle relaxants.  Patient agreed with the plan [AE]    Clinical Course User Index [AE] Awilda Lennox, PA-C    IMPRESSION / MDM / ASSESSMENT AND PLAN / ED COURSE  I reviewed the triage vital signs and the nursing  notes.  Differential diagnosis includes, but is not limited to, DVT, thrombophlebitis, vascular disease.  Patient's presentation is most consistent with acute complicated illness / injury requiring diagnostic workup.   Summer Shaffer a 43 y.o.female who presents today for right lower extremity pain, physical exam skin was intact without changes in color, no signs of infection or trauma, pulses positive.  Did order Doppler of the lower extremities, it came back negative for DVT. I independently reviewed and interpreted imaging and agree with radiologists findings. Patient's diagnosis is consistent with muscle strain of the right leg. Labs are  reassuring. I did review the patient's allergies and medications.The patient is in stable and satisfactory condition for discharge home.   Patient will be discharged home with prescriptions for meloxicam, Flexeril .  Patient requested referral to orthopedic for a follow-up of her back pain.  Patient is to follow up with orthopedics as needed or otherwise directed. Patient is given ED precautions to return to the ED for any worsening or new symptoms. Discussed plan of care with patient, answered all of patient's questions, Patient agreeable to plan of care. Advised patient to take medications according to the instructions on the label. Discussed possible side effects of new medications. Patient verbalized understanding.   FINAL CLINICAL IMPRESSION(S) / ED DIAGNOSES   Final diagnoses:  Right leg pain  Acute low back pain, unspecified back pain laterality, unspecified whether sciatica present     Rx / DC Orders   ED Discharge Orders          Ordered    meloxicam (MOBIC) 15 MG tablet  Daily        12/04/23 2107    cyclobenzaprine  (FLEXERIL ) 10 MG tablet  3 times daily PRN        12/04/23 2107             Note:  This document was prepared using Dragon voice recognition software and may include unintentional dictation errors.   Awilda Lennox, PA-C 12/04/23 2110    Bradler, Evan K, MD 12/05/23 1911

## 2023-12-04 NOTE — Discharge Instructions (Addendum)
 You have been diagnosed with right leg pain, Doppler of the right leg came back negative for DVT.  You can take Flexeril  1 tablet by mouth every 8 hours after meals.  Please take meloxicam 1 tablet by mouth with breakfast as needed for pain.  Please call orthopedics to make an appointment for a follow-up for your back pain.  Please come back to ED or go to your PCP if you have new symptoms or symptoms worsen

## 2023-12-04 NOTE — ED Triage Notes (Signed)
 Patient to ED via POV for right lower leg pain. Denies injury. States she woke up with sharp pain this AM.

## 2023-12-07 ENCOUNTER — Other Ambulatory Visit: Payer: Self-pay | Admitting: Family Medicine

## 2023-12-09 ENCOUNTER — Telehealth: Admitting: Family Medicine

## 2023-12-09 DIAGNOSIS — N760 Acute vaginitis: Secondary | ICD-10-CM | POA: Diagnosis not present

## 2023-12-09 MED ORDER — FLUCONAZOLE 150 MG PO TABS: ORAL_TABLET | ORAL | 0 refills | Status: DC

## 2023-12-09 NOTE — Progress Notes (Signed)

## 2023-12-16 ENCOUNTER — Ambulatory Visit (INDEPENDENT_AMBULATORY_CARE_PROVIDER_SITE_OTHER): Admitting: Family Medicine

## 2023-12-16 ENCOUNTER — Encounter: Payer: Self-pay | Admitting: Family Medicine

## 2023-12-16 VITALS — BP 100/80 | HR 113

## 2023-12-16 DIAGNOSIS — M5416 Radiculopathy, lumbar region: Secondary | ICD-10-CM | POA: Diagnosis not present

## 2023-12-16 DIAGNOSIS — F5104 Psychophysiologic insomnia: Secondary | ICD-10-CM

## 2023-12-16 DIAGNOSIS — F4323 Adjustment disorder with mixed anxiety and depressed mood: Secondary | ICD-10-CM | POA: Diagnosis not present

## 2023-12-16 DIAGNOSIS — F411 Generalized anxiety disorder: Secondary | ICD-10-CM

## 2023-12-16 DIAGNOSIS — F410 Panic disorder [episodic paroxysmal anxiety] without agoraphobia: Secondary | ICD-10-CM

## 2023-12-16 MED ORDER — SERTRALINE HCL 50 MG PO TABS: 50.0000 mg | ORAL_TABLET | Freq: Every day | ORAL | 3 refills | Status: DC

## 2023-12-16 MED ORDER — LORAZEPAM 0.5 MG PO TABS: 0.5000 mg | ORAL_TABLET | Freq: Two times a day (BID) | ORAL | 2 refills | Status: AC | PRN

## 2023-12-16 NOTE — Assessment & Plan Note (Signed)
 Patient has been taking wellbutrin  consistently which has helped some but unfortunately she is still having a good deal of anxiety, feeling as though she needs a lorazepam  every day to calm things down. We discussed about how we may bring her baseline anxiety down so that her lorazepam  can be only used as needed for breakthrough anxiety/panic attacks. Decided to try zoloft 50mg  in addition to the wellbutrin  to see how this regimen will work for her. Discussed side effects and patient is amenable to the plan. -Begin Zoloft and continue wellbutrin  -Refilled Lorazepam  for PRN use -Discussed benefits of getting involved with therapy -Follow up in 6 weeks to assess medication efficacy

## 2023-12-16 NOTE — Assessment & Plan Note (Signed)
 Patient is currently being worked up for this lumbar radiculopathy by [facility] and as an MRI on 6/24 to evaluate for nerve impingement. Pending this, they are going to see if she is a candidate for injection therapy. She did want her medicines clarified in terms of what they are for and if they can be taken together. -Patient advised not to take advil and meloxicam  together only either/or. -Currently taking Cyclobenzaprine  only at night -Taking gabapentin during the day with no associated drowsiness -Follow up with ortho after MRI for management

## 2023-12-16 NOTE — Progress Notes (Signed)
 Established Patient Office Visit  Subjective   Patient ID: Summer Shaffer, female    DOB: 1980-08-27  Age: 43 y.o. MRN: [id number]  Chief Complaint  Patient presents with   Medication Refill    Needing refill on lorazepam .    Medical Management of Chronic Issues    Inflammation is really bad and would like to know if its anything that can be done about it   Hypertension    She reports excellent compliance with treatment. She is not having side effects.  She is following a Regular diet. She is not exercising. She does not smoke. Symptoms: none   Anxiety    She reports fair compliance with treatment due to being out of rx for one month She reports fair tolerance of treatment. She is not having side effects.  She feels her anxiety is moderate and Improved since last visit. Symptoms: irritable, fatigue, insomnia     Medication Consultation    Would like to discuss all medications provided after being seen at ER and by Dr.Nappo at [facility]. Wanting to make sure its okay to take all of them.     Summer Shaffer is a 43 y/o female presenting today for follow-up following a visit to the emergency department for low back and right lower leg pain. Patient presented to ER with right lower leg pain and received a workup initially for DVT which was negative. It was determined then that her back and leg pain were likely related and she was referred to [facility] where they did plain films on her back and ordered an MRI to assess for lumbar nerve impingement. Between the ER and the ortho office she received a prednisone  dosepak, meloxicam , cyclobenzaprine  and gabapentin. She has been taking the gabapentin during the day and the cyclobenzaprine  at night which seem to help a little bit. She is not currently taking the meloxicam  but has been taking 800mg  ibuprofen thrice daily. She has finished the dosepak which was helpful for her. She is interested to know about any interactions between all  of these medications. She has been seeing a chiropractor for many years and also frequently for this back pain which she says has been helpful to her. She denies any changes to bowel or bladder function. She does endorse pain that will wake her from sleep.  She also noted that her anxiety, while improved on the wellbutrin , is still so bad that she feels that she needs to take a lorazapam almost daily to relieve her anxiety symptoms. She would like to discuss what we can do going forward to help get her anxiety better managed. She states her anxiety is typically worse during work but is better when not working.   Past Medical History:  Diagnosis Date   Allergy    Anemia    Anxiety    Migraines    Pregnancy induced hypertension    SUI (stress urinary incontinence, female)    Vitamin D  deficiency    Past Surgical History:  Procedure Laterality Date   TONSILLECTOMY AND ADENOIDECTOMY        Objective:     BP 100/80 (BP Location: Left Arm, Patient Position: Sitting, Cuff Size: Large)   Pulse (!) 113   SpO2 96%  BP Readings from Last 3 Encounters:  12/16/23 100/80  12/04/23 (!) 133/117  06/10/23 124/85   Wt Readings from Last 3 Encounters:  12/04/23 184 lb (83.5 kg)  06/10/23 189 lb (85.7 kg)  04/28/23 187 lb 11.2 oz (  85.1 kg)      Physical Exam Constitutional:      General: She is not in acute distress.    Appearance: Normal appearance.  HENT:     Head: Normocephalic and atraumatic.     Right Ear: External ear normal.     Left Ear: External ear normal.     Nose: Nose normal.     Mouth/Throat:     Mouth: Mucous membranes are moist.     Pharynx: Oropharynx is clear. No oropharyngeal exudate.   Eyes:     General: No scleral icterus.    Conjunctiva/sclera: Conjunctivae normal.     Pupils: Pupils are equal, round, and reactive to light.    Cardiovascular:     Rate and Rhythm: Normal rate.     Pulses: Normal pulses.  Pulmonary:     Effort: Pulmonary effort is normal.  No respiratory distress.     Breath sounds: Normal breath sounds.  Abdominal:     General: Abdomen is flat. There is no distension.   Musculoskeletal:        General: Tenderness present. No swelling or deformity. Normal range of motion.     Cervical back: Normal range of motion. No tenderness.     Right lower leg: No edema.     Left lower leg: No edema.     Comments: Right paraspinal muscle tenderness present   Skin:    General: Skin is warm and dry.     Coloration: Skin is not jaundiced or pale.   Neurological:     General: No focal deficit present.     Mental Status: She is alert and oriented to person, place, and time. Mental status is at baseline.     Comments: Radicular pain felt in right lower leg, currently 5/10.  Psychiatric:        Mood and Affect: Mood normal.        Behavior: Behavior normal.      No results found for any visits on 12/16/23.  Last CBC Lab Results  Component Value Date   WBC 7.2 04/18/2023   HGB 13.6 04/18/2023   HCT 41.4 04/18/2023   MCV 90 04/18/2023   MCH 29.6 04/18/2023   RDW 11.5 (L) 04/18/2023   PLT 351 04/18/2023   Last metabolic panel Lab Results  Component Value Date   GLUCOSE 90 12/04/2023   NA 136 12/04/2023   K 3.7 12/04/2023   CL 104 12/04/2023   CO2 23 12/04/2023   BUN 12 12/04/2023   CREATININE 0.73 12/04/2023   GFRNONAA >60 12/04/2023   CALCIUM 8.9 12/04/2023   PROT 8.5 (H) 12/04/2023   ALBUMIN 4.0 12/04/2023   LABGLOB 3.5 04/18/2023   BILITOT 0.7 12/04/2023   ALKPHOS 58 12/04/2023   AST 17 12/04/2023   ALT 16 12/04/2023   ANIONGAP 9 12/04/2023      The 10-year ASCVD risk score (Arnett DK, et al., 2019) is: 0.6%    Assessment & Plan:   Problem List Items Addressed This Visit       Nervous and Auditory   Lumbar radiculopathy   Patient is currently being worked up for this lumbar radiculopathy by [facility] and as an MRI on 6/24 to evaluate for nerve impingement. Pending this, they are going to see if  she is a candidate for injection therapy. She did want her medicines clarified in terms of what they are for and if they can be taken together. -Patient advised not to take advil and meloxicam   together only either/or. -Currently taking Cyclobenzaprine  only at night -Taking gabapentin during the day with no associated drowsiness -Follow up with ortho after MRI for management      Relevant Medications   gabapentin (NEURONTIN) 300 MG capsule   cyclobenzaprine  (FLEXERIL ) 10 MG tablet   LORazepam  (ATIVAN ) 0.5 MG tablet   sertraline (ZOLOFT) 50 MG tablet     Other   Psychophysiological insomnia   Adjustment disorder with mixed anxiety and depressed mood   Generalized anxiety disorder with panic attacks - Primary   Patient has been taking wellbutrin  consistently which has helped some but unfortunately she is still having a good deal of anxiety, feeling as though she needs a lorazepam  every day to calm things down. We discussed about how we may bring her baseline anxiety down so that her lorazepam  can be only used as needed for breakthrough anxiety/panic attacks. Decided to try zoloft 50mg  in addition to the wellbutrin  to see how this regimen will work for her. Discussed side effects and patient is amenable to the plan. -Begin Zoloft and continue wellbutrin  -Refilled Lorazepam  for PRN use -Discussed benefits of getting involved with therapy -Follow up in 6 weeks to assess medication efficacy      Relevant Medications   LORazepam  (ATIVAN ) 0.5 MG tablet   sertraline (ZOLOFT) 50 MG tablet    Return in about 6 weeks (around 01/27/2024) for MDD/GAD f/u, virtual ok.    Monda Angry, Medical Student   Patient seen along with MS3 student, Luther Saltness. I personally evaluated this patient along with the student, and verified all aspects of the history, physical exam, and medical decision making as documented by the student. I agree with the student's documentation and have made all necessary  edits.  Sharell Hilmer, Stan Eans, MD, MPH [facility]

## 2024-01-08 ENCOUNTER — Other Ambulatory Visit: Payer: Self-pay | Admitting: Family Medicine

## 2024-01-10 ENCOUNTER — Other Ambulatory Visit: Payer: Self-pay | Admitting: Family Medicine

## 2024-01-12 NOTE — Telephone Encounter (Signed)
 Requested Prescriptions  Refused Prescriptions Disp Refills   sertraline  (ZOLOFT ) 50 MG tablet [Pharmacy Med Name: SERTRALINE  HCL 50 MG TABLET] 90 tablet 2    Sig: TAKE 1 TABLET BY MOUTH EVERY DAY     Psychiatry:  Antidepressants - SSRI - sertraline  Passed - 01/12/2024  5:51 PM      Passed - AST in normal range and within 360 days    AST  Date Value Ref Range Status  12/04/2023 17 15 - 41 U/L Final         Passed - ALT in normal range and within 360 days    ALT  Date Value Ref Range Status  12/04/2023 16 0 - 44 U/L Final         Passed - Completed PHQ-2 or PHQ-9 in the last 360 days      Passed - Valid encounter within last 6 months    Recent Outpatient Visits           3 weeks ago Generalized anxiety disorder with panic attacks   [facility] [place], Jon HERO, MD       Future Appointments             In 2 weeks Bacigalupo, Jon HERO, MD [facility]

## 2024-01-27 ENCOUNTER — Telehealth: Admitting: Family Medicine

## 2024-01-27 ENCOUNTER — Encounter: Payer: Self-pay | Admitting: Family Medicine

## 2024-01-27 DIAGNOSIS — F410 Panic disorder [episodic paroxysmal anxiety] without agoraphobia: Secondary | ICD-10-CM

## 2024-01-27 DIAGNOSIS — F411 Generalized anxiety disorder: Secondary | ICD-10-CM | POA: Diagnosis not present

## 2024-01-27 DIAGNOSIS — F5104 Psychophysiologic insomnia: Secondary | ICD-10-CM | POA: Diagnosis not present

## 2024-01-27 MED ORDER — ESCITALOPRAM OXALATE 5 MG PO TABS: 5.0000 mg | ORAL_TABLET | Freq: Every day | ORAL | 2 refills | Status: DC

## 2024-01-27 NOTE — Progress Notes (Signed)
 MyChart Video Visit    Virtual Visit via Video Note   This format is felt to be most appropriate for this patient at this time. Physical exam was limited by quality of the video and audio technology used for the visit.    Patient location: home Provider location: [facility] Persons involved in the visit: patient, provider  I discussed the limitations of evaluation and management by telemedicine and the availability of in person appointments. The patient expressed understanding and agreed to proceed.  Patient: Summer Shaffer   DOB: 1981-01-28   43 y.o. Female  MRN: [id number] Visit Date: 01/27/2024  Today's healthcare provider: Jon Eva, MD   No chief complaint on file.  Subjective    HPI   Discussed the use of AI scribe software for clinical note transcription with the patient, who gave verbal consent to proceed.  History of Present Illness   Summer Shaffer is a 43 year old female with generalized anxiety disorder who presents with headaches and insomnia after starting Zoloft .  She began Zoloft  50 mg daily six weeks ago for generalized anxiety disorder, initially experiencing insomnia and panic attacks. Since starting Zoloft , she has developed an upset stomach, prompting her to take the medication at night. She now experiences mild, frontal headaches every morning, which resolve later in the day. Her anxiety symptoms have improved. She is currently not working and plans to return to work in three weeks. She reports trouble falling asleep or staying asleep, poor appetite, and low self-esteem. She denies feeling depressed, hopeless, or having thoughts of self-harm. She sometimes feels nervous or on edge, has trouble relaxing, and experiences restlessness due to back issues. She does not have difficulty managing daily activities.         01/27/2024    3:40 PM 06/10/2023   10:28 AM 04/28/2023    2:47 PM 04/17/2023    2:24 PM 06/12/2022    4:10 PM   Depression screen PHQ 2/9  Decreased Interest 0 1 1 1  0  Down, Depressed, Hopeless 0 0 0 0 0  PHQ - 2 Score 0 1 1 1  0  Altered sleeping 2 0 3 3 3   Tired, decreased energy 0 2 2 2 2   Change in appetite 2 0 1 0 0  Feeling bad or failure about yourself  1 0 0 0 0  Trouble concentrating 1 0 1 0 0  Moving slowly or fidgety/restless 0  0 0 1  Suicidal thoughts 0 0 0 0 0  PHQ-9 Score 6 3 8 6 6   Difficult doing work/chores Not difficult at all  Not difficult at all  Somewhat difficult       01/27/2024    3:42 PM 06/10/2023   10:28 AM 04/28/2023    2:47 PM 04/17/2023    2:23 PM  GAD 7 : Generalized Anxiety Score  Nervous, Anxious, on Edge 1 2 2 1   Control/stop worrying 0 0 0 1  Worry too much - different things 1 0 0 1  Trouble relaxing 2 2 3 3   Restless 2 0 0 0  Easily annoyed or irritable 1 2 1 1   Afraid - awful might happen 0 0 0 0  Total GAD 7 Score 7 6 6 7   Anxiety Difficulty Not difficult at all Not difficult at all Somewhat difficult      Review of Systems      Objective    There were no vitals taken for this visit.  Physical Exam Constitutional:      General: She is not in acute distress.    Appearance: Normal appearance.  HENT:     Head: Normocephalic.  Pulmonary:     Effort: Pulmonary effort is normal. No respiratory distress.  Neurological:     Mental Status: She is alert and oriented to person, place, and time. Mental status is at baseline.        Assessment & Plan     Problem List Items Addressed This Visit       Other   Psychophysiological insomnia   Generalized anxiety disorder with panic attacks - Primary   Generalized anxiety disorder with depressive symptoms Generalized anxiety disorder with insomnia and panic attacks. Started sertraline  (Zoloft ) 50 mg daily six weeks ago, with improvement in anxiety symptoms but experiencing headaches as a side effect. Decision made to switch to Lexapro  to mitigate side effects while maintaining  therapeutic benefits. Lexapro  expected to have a reduced side effect profile due to existing serotonin levels from Zoloft . No history of other similar medications except a brief postpartum treatment nine years ago. Discussed potential for similar side effects with other SSRIs and alternative options such as SNRIs if needed. - Initiate Lexapro  5 mg daily. D/c Zoloft  - Conduct depression and anxiety screening for baseline comparison. - Schedule follow-up appointment in six weeks to assess response to Lexapro .  Headache secondary to sertraline  (Zoloft ) Headaches developed after starting sertraline  (Zoloft ), occurring daily but decreasing in intensity. Likely a side effect of Zoloft . Decision to switch to Lexapro  to alleviate headaches while maintaining anxiety management. - Initiate Lexapro  5 mg daily.       Relevant Medications   escitalopram  (LEXAPRO ) 5 MG tablet     Meds ordered this encounter  Medications   escitalopram  (LEXAPRO ) 5 MG tablet    Sig: Take 1 tablet (5 mg total) by mouth daily.    Dispense:  30 tablet    Refill:  2     Return in about 6 weeks (around 03/09/2024) for MDD/GAD f/u, virtual ok.     I discussed the assessment and treatment plan with the patient. The patient was provided an opportunity to ask questions and all were answered. The patient agreed with the plan and demonstrated an understanding of the instructions.   The patient was advised to call back or seek an in-person evaluation if the symptoms worsen or if the condition fails to improve as anticipated.  Jon Eva, MD [facility] [phone] (phone) [phone] (fax)  [facility]

## 2024-01-27 NOTE — Assessment & Plan Note (Signed)
 Generalized anxiety disorder with depressive symptoms Generalized anxiety disorder with insomnia and panic attacks. Started sertraline  (Zoloft ) 50 mg daily six weeks ago, with improvement in anxiety symptoms but experiencing headaches as a side effect. Decision made to switch to Lexapro  to mitigate side effects while maintaining therapeutic benefits. Lexapro  expected to have a reduced side effect profile due to existing serotonin levels from Zoloft . No history of other similar medications except a brief postpartum treatment nine years ago. Discussed potential for similar side effects with other SSRIs and alternative options such as SNRIs if needed. - Initiate Lexapro  5 mg daily. D/c Zoloft  - Conduct depression and anxiety screening for baseline comparison. - Schedule follow-up appointment in six weeks to assess response to Lexapro .  Headache secondary to sertraline  (Zoloft ) Headaches developed after starting sertraline  (Zoloft ), occurring daily but decreasing in intensity. Likely a side effect of Zoloft . Decision to switch to Lexapro  to alleviate headaches while maintaining anxiety management. - Initiate Lexapro  5 mg daily.

## 2024-02-04 ENCOUNTER — Other Ambulatory Visit: Payer: Self-pay | Admitting: Family Medicine

## 2024-02-08 ENCOUNTER — Telehealth: Admitting: Family

## 2024-02-08 DIAGNOSIS — B3731 Acute candidiasis of vulva and vagina: Secondary | ICD-10-CM

## 2024-02-08 MED ORDER — FLUCONAZOLE 150 MG PO TABS: 150.0000 mg | ORAL_TABLET | ORAL | 0 refills | Status: DC | PRN

## 2024-02-08 NOTE — Progress Notes (Signed)

## 2024-02-18 ENCOUNTER — Encounter: Payer: Self-pay | Admitting: Family Medicine

## 2024-02-18 ENCOUNTER — Other Ambulatory Visit: Payer: Self-pay | Admitting: Family Medicine

## 2024-02-18 NOTE — Telephone Encounter (Signed)
FYI: message from the pt.

## 2024-02-22 ENCOUNTER — Other Ambulatory Visit: Payer: Self-pay | Admitting: Family Medicine

## 2024-02-22 DIAGNOSIS — G5682 Other specified mononeuropathies of left upper limb: Secondary | ICD-10-CM

## 2024-02-22 DIAGNOSIS — G5681 Other specified mononeuropathies of right upper limb: Secondary | ICD-10-CM

## 2024-02-24 NOTE — Telephone Encounter (Signed)
 Unable to refill per protocol, Rx expired. Discontinued 12/04/23.  Requested Prescriptions  Pending Prescriptions Disp Refills   celecoxib  (CELEBREX ) 200 MG capsule [Pharmacy Med Name: CELECOXIB  200 MG CAPSULE] 60 capsule 2    Sig: TAKE 1 CAPSULE BY MOUTH TWICE A DAY     Analgesics:  COX2 Inhibitors Failed - 02/24/2024 10:25 AM      Failed - Manual Review: Labs are only required if the patient has taken medication for more than 8 weeks.      Passed - HGB in normal range and within 360 days    Hemoglobin  Date Value Ref Range Status  04/18/2023 13.6 11.1 - 15.9 g/dL Final         Passed - Cr in normal range and within 360 days    Creatinine, Ser  Date Value Ref Range Status  12/04/2023 0.73 0.44 - 1.00 mg/dL Final         Passed - HCT in normal range and within 360 days    Hematocrit  Date Value Ref Range Status  04/18/2023 41.4 34.0 - 46.6 % Final         Passed - AST in normal range and within 360 days    AST  Date Value Ref Range Status  12/04/2023 17 15 - 41 U/L Final         Passed - ALT in normal range and within 360 days    ALT  Date Value Ref Range Status  12/04/2023 16 0 - 44 U/L Final         Passed - eGFR is 30 or above and within 360 days    GFR, Estimated  Date Value Ref Range Status  12/04/2023 >60 >60 mL/min Final    Comment:    (NOTE) Calculated using the CKD-EPI Creatinine Equation (2021)    GFR  Date Value Ref Range Status  05/11/2019 101.74 >60.00 mL/min Final   eGFR  Date Value Ref Range Status  04/18/2023 102 >59 mL/min/1.73 Final         Passed - Patient is not pregnant      Passed - Valid encounter within last 12 months    Recent Outpatient Visits           4 weeks ago Generalized anxiety disorder with panic attacks   [facility] [place], Jon HERO, MD   2 months ago Generalized anxiety disorder with panic attacks   [facility] [place], Jon HERO, MD       Future  Appointments             In 2 weeks Bacigalupo, Jon HERO, MD [facility]

## 2024-02-29 ENCOUNTER — Other Ambulatory Visit: Payer: Self-pay | Admitting: Family Medicine

## 2024-03-09 ENCOUNTER — Telehealth: Admitting: Family Medicine

## 2024-04-05 ENCOUNTER — Other Ambulatory Visit: Payer: Self-pay | Admitting: Family Medicine

## 2024-04-11 ENCOUNTER — Telehealth: Admitting: Physician Assistant

## 2024-04-11 DIAGNOSIS — B3731 Acute candidiasis of vulva and vagina: Secondary | ICD-10-CM | POA: Diagnosis not present

## 2024-04-11 MED ORDER — FLUCONAZOLE 150 MG PO TABS
150.0000 mg | ORAL_TABLET | Freq: Every day | ORAL | 0 refills | Status: DC
Start: 2024-04-11 — End: 2024-05-02

## 2024-04-11 NOTE — Progress Notes (Signed)

## 2024-05-02 ENCOUNTER — Telehealth: Admitting: Family

## 2024-05-02 DIAGNOSIS — B3731 Acute candidiasis of vulva and vagina: Secondary | ICD-10-CM

## 2024-05-02 MED ORDER — FLUCONAZOLE 150 MG PO TABS: 150.0000 mg | ORAL_TABLET | ORAL | 0 refills | Status: DC | PRN

## 2024-05-02 NOTE — Progress Notes (Signed)

## 2024-05-04 ENCOUNTER — Telehealth: Admitting: Physician Assistant

## 2024-05-04 DIAGNOSIS — J069 Acute upper respiratory infection, unspecified: Secondary | ICD-10-CM

## 2024-05-05 MED ORDER — PROMETHAZINE-DM 6.25-15 MG/5ML PO SYRP: 5.0000 mL | ORAL_SOLUTION | Freq: Four times a day (QID) | ORAL | 0 refills | Status: AC | PRN

## 2024-05-05 MED ORDER — FLUTICASONE PROPIONATE 50 MCG/ACT NA SUSP: 2.0000 | Freq: Every day | NASAL | 0 refills | Status: AC

## 2024-05-05 NOTE — Progress Notes (Signed)

## 2024-05-31 ENCOUNTER — Other Ambulatory Visit: Payer: Self-pay | Admitting: Family Medicine

## 2024-05-31 DIAGNOSIS — I10 Essential (primary) hypertension: Secondary | ICD-10-CM

## 2024-07-08 ENCOUNTER — Other Ambulatory Visit: Payer: Self-pay | Admitting: Family Medicine

## 2024-07-08 ENCOUNTER — Encounter: Payer: Self-pay | Admitting: Internal Medicine

## 2024-07-08 ENCOUNTER — Ambulatory Visit: Admitting: Internal Medicine

## 2024-07-08 ENCOUNTER — Ambulatory Visit: Payer: Self-pay

## 2024-07-08 VITALS — BP 118/78 | HR 98 | Temp 98.5°F | Resp 16 | Ht 64.0 in | Wt 180.7 lb

## 2024-07-08 DIAGNOSIS — N83201 Unspecified ovarian cyst, right side: Secondary | ICD-10-CM | POA: Diagnosis not present

## 2024-07-08 DIAGNOSIS — M5416 Radiculopathy, lumbar region: Secondary | ICD-10-CM

## 2024-07-08 DIAGNOSIS — M48061 Spinal stenosis, lumbar region without neurogenic claudication: Secondary | ICD-10-CM | POA: Diagnosis not present

## 2024-07-08 MED ORDER — TIZANIDINE HCL 4 MG PO TABS: 4.0000 mg | ORAL_TABLET | Freq: Every evening | ORAL | 0 refills | Status: DC | PRN

## 2024-07-08 MED ORDER — NAPROXEN 500 MG PO TABS: 500.0000 mg | ORAL_TABLET | Freq: Two times a day (BID) | ORAL | 0 refills | Status: AC

## 2024-07-08 NOTE — Telephone Encounter (Signed)
 FYI Only or Action Required?: FYI only for provider: appointment scheduled on 1/8 at other office.  Patient was last seen in primary care on 01/27/2024 by Myrla Jon HERO, MD.  Called Nurse Triage reporting Sciatica.  Symptoms began a week ago. worsening  Interventions attempted: Rest, hydration, or home remedies.  Symptoms are: gradually worsening.  Triage Disposition: See HCP Within 4 Hours (Or PCP Triage)  Patient/caregiver understands and will follow disposition?: Yes      Copied from CRM #[id number]. Topic: Clinical - Red Word Triage >> Jul 08, 2024 11:07 AM Rea BROCKS wrote: Red Word that prompted transfer to Nurse Triage: Sciatica going down right leg- severe pain Reason for Disposition  [1] SEVERE pain (e.g., excruciating, unable to do any normal activities) AND [2] not improved after 2 hours of pain medicine  Answer Assessment - Initial Assessment Questions This RN recommended pt be examined in next 4 hours, scheduled for today with other office. Advised call back or seek immediate care if new or worsening symptoms.    Last year treated for bulged disc in lumbar, started with sciatica down to shin, got much better Emerg ortho had given option of surgery, she declined and this is where I'm at [facility] won't let me go until PCP refers This week started acting up Pain in shin because of nerve being pinched in lower back No swelling, redness No chest pain, SOB Limping 7/10, at night 8/10  Protocols used: Leg Pain-A-AH

## 2024-07-08 NOTE — Progress Notes (Signed)
 "  Acute Office Visit  Subjective:     Patient ID: Summer Shaffer, female    DOB: 08/23/1980, 44 y.o.   MRN: [id number]  Chief Complaint  Patient presents with   Sciatica    Right side    HPI Patient is in today for right sciatica flare. This is my first time meeting her.   Discussed the use of AI scribe software for clinical note transcription with the patient, who gave verbal consent to proceed.  History of Present Illness Summer Shaffer is a 44 year old female with sciatica who presents with worsening sciatica pain.  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 Prior care has included physical therapy, chiropractic treatment, and a stem cell procedure. She was off work for three months last year and returned in August. She now sees a land weekly for maintenance.  Over the past three days she has had an acute flare, with pain increasing from 2-3/10 to 8/10, now disturbing her sleep. 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 She is worried about this recurrence and wants further evaluation.  She has had two prior cortisone injections. The first gave one month of relief. The second caused a vasovagal episode with marked hypotension. She is taking ibuprofen without benefit and recently completed a prednisone  course that did not provide lasting relief.  She recalls a September MRI showing a broad-based L4-L5 disc protrusion.  She has a Mirena  IUD placed in 2020 after a miscarriage and believes it remains effective.    Review of Systems  Musculoskeletal:  Positive for back pain.  Neurological:  Positive for tingling. Negative for weakness.        Objective:    BP 118/78 (Cuff Size: Large)   Pulse 98   Temp 98.5 F (36.9 C) (Oral)   Resp 16   Ht 5' 4 (1.626 m)   Wt 180 lb 11.2 oz (82 kg)   SpO2 99%   BMI 31.02 kg/m  BP Readings from Last 3 Encounters:   07/08/24 118/78  12/16/23 100/80  12/04/23 (!) 133/117   Wt Readings from Last 3 Encounters:  07/08/24 180 lb 11.2 oz (82 kg)  12/04/23 184 lb (83.5 kg)  06/10/23 189 lb (85.7 kg)      Physical Exam Constitutional:      Appearance: Normal appearance.  HENT:     Head: Normocephalic and atraumatic.  Eyes:     Conjunctiva/sclera: Conjunctivae normal.  Cardiovascular:     Rate and Rhythm: Normal rate and regular rhythm.  Pulmonary:     Effort: Pulmonary effort is normal.     Breath sounds: Normal breath sounds.  Musculoskeletal:     Thoracic back: Spasms and tenderness present. Normal range of motion.     Lumbar back: Spasms and tenderness present. Decreased range of motion.  Skin:    General: Skin is warm and dry.  Neurological:     General: No focal deficit present.     Mental Status: She is alert. Mental status is at baseline.  Psychiatric:        Mood and Affect: Mood normal.        Behavior: Behavior normal.     No results found for any visits on 07/08/24.      Assessment & Plan:   Assessment & Plan Lumbar radiculopathy due to L4-L5 disc bulge with mild spinal canal stenosis Chronic lumbar radiculopathy with recent  exacerbation. Previous treatments provided limited relief. Current pain level is 8/10, affecting sleep. Differential includes worsening spinal stenosis or disc bulge. - Referred to neurosurgery for further evaluation and management. - Prescribed naproxen  500 mg twice daily for 10 days. - Prescribed tizanidine  (Zanaflex ) at bedtime. - Advised to take naproxen  with food to minimize gastrointestinal side effects. - Instructed to avoid other anti-inflammatories while taking naproxen . - Discussed potential need for another MRI if symptoms persist or worsen.  Right ovarian cyst Incidental finding of a 3.3 cm right ovarian cyst on previous MRI. Differential includes functional cyst related to ovulation. - Ordered pelvic transvaginal ultrasound for further  assessment. - Provided contact information for the ultrasound department to schedule the procedure.  - Ambulatory referral to Neurosurgery - naproxen  (NAPROSYN ) 500 MG tablet; Take 1 tablet (500 mg total) by mouth 2 (two) times daily with a meal for 10 days.  Dispense: 20 tablet; Refill: 0 - tiZANidine  (ZANAFLEX ) 4 MG tablet; Take 1 tablet (4 mg total) by mouth at bedtime as needed.  Dispense: 30 tablet; Refill: 0 - US  PELVIC COMPLETE WITH TRANSVAGINAL; Future   Return for keep follow up with PCP.  Sharyle Fischer, DO   "

## 2024-07-08 NOTE — Telephone Encounter (Signed)
 Noted

## 2024-07-13 ENCOUNTER — Encounter: Payer: Self-pay | Admitting: Neurosurgery

## 2024-07-15 ENCOUNTER — Ambulatory Visit
Admission: RE | Admit: 2024-07-15 | Discharge: 2024-07-15 | Disposition: A | Discharge status: Home / Self Care | Source: Ambulatory Visit | Attending: Internal Medicine | Admitting: Internal Medicine

## 2024-07-15 DIAGNOSIS — N83201 Unspecified ovarian cyst, right side: Secondary | ICD-10-CM | POA: Insufficient documentation

## 2024-07-19 ENCOUNTER — Ambulatory Visit: Payer: Self-pay | Admitting: Internal Medicine

## 2024-07-19 DIAGNOSIS — N83201 Unspecified ovarian cyst, right side: Secondary | ICD-10-CM

## 2024-07-20 ENCOUNTER — Telehealth: Admitting: Physician Assistant

## 2024-07-20 DIAGNOSIS — B3731 Acute candidiasis of vulva and vagina: Secondary | ICD-10-CM

## 2024-07-21 MED ORDER — FLUCONAZOLE 150 MG PO TABS: ORAL_TABLET | ORAL | 0 refills | Status: AC

## 2024-07-21 NOTE — Progress Notes (Signed)

## 2024-07-24 ENCOUNTER — Other Ambulatory Visit: Payer: Self-pay | Admitting: Family Medicine

## 2024-07-26 NOTE — Telephone Encounter (Signed)
 Requested Prescriptions  Pending Prescriptions Disp Refills   cyanocobalamin  (VITAMIN B12) 1000 MCG/ML injection [Pharmacy Med Name: CYANOCOBALAMIN  1,000 MCG/ML VL] 12 mL 1    Sig: INJECT 1 ML (1,000 MCG TOTAL) INTO THE MUSCLE ONCE A WEEK.     Endocrinology:  Vitamins - Vitamin B12 Failed - 07/26/2024 11:29 AM      Failed - HCT in normal range and within 360 days    Hematocrit  Date Value Ref Range Status  04/18/2023 41.4 34.0 - 46.6 % Final         Failed - HGB in normal range and within 360 days    Hemoglobin  Date Value Ref Range Status  04/18/2023 13.6 11.1 - 15.9 g/dL Final         Failed - B12 Level in normal range and within 360 days    Vitamin B-12  Date Value Ref Range Status  04/18/2023 332 232 - 1,245 pg/mL Final         Passed - Valid encounter within last 12 months    Recent Outpatient Visits           2 weeks ago Spinal stenosis of lumbar region, unspecified whether neurogenic claudication present   [facility] Bern[facility] Fend, DO   6 months ago Generalized anxiety disorder with panic attacks   [facility] [place], Jon HERO, MD   7 months ago Generalized anxiety disorder with panic attacks   [facility] [place], Jon HERO, MD

## 2024-07-30 ENCOUNTER — Other Ambulatory Visit: Payer: Self-pay | Admitting: Internal Medicine

## 2024-07-30 DIAGNOSIS — M5416 Radiculopathy, lumbar region: Secondary | ICD-10-CM

## 2024-07-30 DIAGNOSIS — M48061 Spinal stenosis, lumbar region without neurogenic claudication: Secondary | ICD-10-CM

## 2024-07-30 NOTE — Telephone Encounter (Signed)
 Requested medications are due for refill today.  yes  Requested medications are on the active medications list.  yes  Last refill. 07/08/2024 #30 0 rf  Future visit scheduled.   yes  Notes to clinic.  Refill not delegated.     Requested Prescriptions  Pending Prescriptions Disp Refills   tiZANidine  (ZANAFLEX ) 4 MG tablet [Pharmacy Med Name: TIZANIDINE  HCL 4 MG TABLET] 90 tablet 1    Sig: TAKE 1 TABLET (4 MG TOTAL) BY MOUTH EVERY DAY AT BEDTIME AS NEEDED     Not Delegated - Cardiovascular:  Alpha-2 Agonists - tizanidine  Failed - 07/30/2024  5:54 PM      Failed - This refill cannot be delegated      Failed - Valid encounter within last 6 months    Recent Outpatient Visits           3 weeks ago Spinal stenosis of lumbar region, unspecified whether neurogenic claudication present   [facility] Bernardo Fend, DO   6 months ago Generalized anxiety disorder with panic attacks   [facility] [place], Jon HERO, MD   7 months ago Generalized anxiety disorder with panic attacks   [facility] [place], Jon HERO, MD

## 2024-08-04 ENCOUNTER — Telehealth: Payer: Self-pay | Admitting: Neurosurgery

## 2024-08-04 NOTE — Telephone Encounter (Signed)
 LVM for pt to call back and schedule a new patient appt w/ MD per Edsel.   I was looking at [facility] schedule in [place] since she lives in [place]. He has availability on 08/23/24.

## 2024-08-05 ENCOUNTER — Telehealth

## 2024-08-05 DIAGNOSIS — J111 Influenza due to unidentified influenza virus with other respiratory manifestations: Secondary | ICD-10-CM

## 2024-08-05 DIAGNOSIS — B349 Viral infection, unspecified: Secondary | ICD-10-CM

## 2024-08-06 MED ORDER — AZELASTINE HCL 0.1 % NA SOLN: 2.0000 | Freq: Two times a day (BID) | NASAL | 0 refills | Status: AC

## 2024-08-06 MED ORDER — BENZONATATE 100 MG PO CAPS: 100.0000 mg | ORAL_CAPSULE | Freq: Two times a day (BID) | ORAL | 0 refills | Status: AC | PRN

## 2024-08-06 MED ORDER — OSELTAMIVIR PHOSPHATE 75 MG PO CAPS: 75.0000 mg | ORAL_CAPSULE | Freq: Two times a day (BID) | ORAL | 0 refills | Status: AC

## 2024-08-06 NOTE — Progress Notes (Signed)
 E visit for Flu like symptoms   We are sorry that you are not feeling well.  Here is how we plan to help! Based on what you have shared with me it looks like you may have a respiratory virus that may be influenza.  Influenza or the flu is  an infection caused by a respiratory virus. The flu virus is highly contagious and persons who did not receive their yearly flu vaccination may catch the flu from close contact.  We have anti-viral medications to treat the viruses that cause this infection. They are not a cure and only shorten the course of the infection. These prescriptions are most effective when they are given within the first 2 days of flu symptoms. Antiviral medications are indicated if you have a high risk of complications from the flu. You should  also consider an antiviral medication if you are in close contact with someone who is at risk. These medications can help patients avoid complications from the flu but have side effects that you should know.   Possible side effects from Tamiflu or oseltamivir include nausea, vomiting, diarrhea, dizziness, headaches, eye redness, sleep problems or other respiratory symptoms. You should not take Tamiflu if you have an allergy to oseltamivir or any to the ingredients in Tamiflu.  Based upon your symptoms and potential risk factors I have prescribed Oseltamivir (Tamiflu).  It has been sent to your designated pharmacy.  You will take one 75 mg capsule orally twice a day for the next 5 days.   For nasal congestion, you may use an oral decongestant such as Mucinex D or if you have glaucoma or high blood pressure use plain Mucinex.  Saline nasal spray or nasal drops can help and can safely be used as often as needed for congestion.  If you have a sore or scratchy throat, use a saltwater gargle-  to  teaspoon of salt dissolved in a 4-ounce to 8-ounce glass of warm water.  Gargle the solution for approximately 15-30 seconds and then spit.  It is  important not to swallow the solution.  You can also use throat lozenges/cough drops and Chloraseptic spray to help with throat pain or discomfort.  Warm or cold liquids can also be helpful in relieving throat pain.  For headache, pain or general discomfort, you can use Ibuprofen or Tylenol as directed.   Some authorities believe that zinc sprays or the use of Echinacea may shorten the course of your symptoms.  I have prescribed the following medications to help lessen symptoms: I have prescribed Tessalon  Perles 100 mg. You may take 1-2 capsules every 8 hours as needed for cough and I have prescribed Azelastine  nasal spray 2 sprays in each nostril twice per day  You are to isolate at home until you have been fever-free for at least 24 hours without a fever-reducing medication, and symptoms have been steadily improving for 24 hours.  If you must be around other household members who do not have symptoms, you need to make sure that both you and the family members are masking consistently with a high-quality mask.  If you note any worsening of symptoms despite treatment, please seek an in-person evaluation ASAP. If you note any significant shortness of breath or any chest pain, please seek ED evaluation. Please do not delay care!  ANYONE WHO HAS FLU SYMPTOMS SHOULD: Stay home. The flu is highly contagious and going out or to work exposes others! Be sure to drink plenty of fluids. Water is  fine as well as fruit juices, sodas and electrolyte beverages. You may want to stay away from caffeine or alcohol. If you are nauseated, try taking small sips of liquids. How do you know if you are getting enough fluid? Your urine should be a pale yellow or almost colorless. Get rest. Taking a steamy shower or using a humidifier may help nasal congestion and ease sore throat pain. Using a saline nasal spray works much the same way. Cough drops, hard candies and sore throat lozenges may ease your cough. Line up a  caregiver. Have someone check on you regularly.  GET HELP RIGHT AWAY IF: You cannot keep down liquids or your medications. You become short of breath Your fell like you are going to pass out or loose consciousness. Your symptoms persist after you have completed your treatment plan  MAKE SURE YOU  Understand these instructions. Will watch your condition. Will get help right away if you are not doing well or get worse.  Your e-visit answers were reviewed by a board certified advanced clinical practitioner to complete your personal care plan.  Depending on the condition, your plan could have included both over the counter or prescription medications.  If there is a problem please reply  once you have received a response from your provider.  Your safety is important to us .  If you have drug allergies check your prescription carefully.    You can use MyChart to ask questions about todays visit, request a non-urgent call back, or ask for a work or school excuse for 24 hours related to this e-Visit. If it has been greater than 24 hours you will need to follow up with your provider, or enter a new e-Visit to address those concerns.  You will get an e-mail in the next two days asking about your experience.  I hope that your e-visit has been valuable and will speed your recovery. Thank you for using e-visits.  I have spent 5 minutes in review of e-visit questionnaire, review and updating patient chart, medical decision making and response to patient.   Jon Belt, PhD, FNP-BC

## 2024-08-23 ENCOUNTER — Encounter: Admitting: Family Medicine

## 2024-08-30 ENCOUNTER — Ambulatory Visit
